# Patient Record
Sex: Male | Born: 1935
Health system: Southern US, Community
[De-identification: ages and names within clinical notes are randomized; demographics above are authoritative.]

## PROBLEM LIST (undated history)

## (undated) DIAGNOSIS — Z87438 Personal history of other diseases of male genital organs: Secondary | ICD-10-CM

## (undated) DIAGNOSIS — I499 Cardiac arrhythmia, unspecified: Secondary | ICD-10-CM

## (undated) DIAGNOSIS — Z86010 Personal history of colonic polyps: Secondary | ICD-10-CM

## (undated) DIAGNOSIS — Z1211 Encounter for screening for malignant neoplasm of colon: Secondary | ICD-10-CM

## (undated) DIAGNOSIS — I2510 Atherosclerotic heart disease of native coronary artery without angina pectoris: Secondary | ICD-10-CM

## (undated) DIAGNOSIS — K439 Ventral hernia without obstruction or gangrene: Secondary | ICD-10-CM

## (undated) DIAGNOSIS — Z8719 Personal history of other diseases of the digestive system: Secondary | ICD-10-CM

## (undated) DIAGNOSIS — E119 Type 2 diabetes mellitus without complications: Secondary | ICD-10-CM

## (undated) DIAGNOSIS — H9190 Unspecified hearing loss, unspecified ear: Secondary | ICD-10-CM

## (undated) DIAGNOSIS — R079 Chest pain, unspecified: Secondary | ICD-10-CM

## (undated) DIAGNOSIS — N400 Enlarged prostate without lower urinary tract symptoms: Secondary | ICD-10-CM

## (undated) DIAGNOSIS — N281 Cyst of kidney, acquired: Secondary | ICD-10-CM

## (undated) DIAGNOSIS — I10 Essential (primary) hypertension: Secondary | ICD-10-CM

## (undated) DIAGNOSIS — I429 Cardiomyopathy, unspecified: Secondary | ICD-10-CM

## (undated) DIAGNOSIS — I7789 Other specified disorders of arteries and arterioles: Secondary | ICD-10-CM

## (undated) DIAGNOSIS — R0600 Dyspnea, unspecified: Secondary | ICD-10-CM

## (undated) DIAGNOSIS — I480 Paroxysmal atrial fibrillation: Secondary | ICD-10-CM

## (undated) DIAGNOSIS — C801 Malignant (primary) neoplasm, unspecified: Secondary | ICD-10-CM

## (undated) DIAGNOSIS — Z860101 Personal history of adenomatous and serrated colon polyps: Secondary | ICD-10-CM

## (undated) HISTORY — PX: SKIN SURGERY: SHX2413

## (undated) HISTORY — PX: DENTAL SURGERY: SHX609

## (undated) HISTORY — PX: OTHER SURGICAL HISTORY: SHX169

## (undated) HISTORY — PX: SEPTOPLASTY: SUR1290

## (undated) HISTORY — DX: Type 2 diabetes mellitus without complications: E11.9

## (undated) HISTORY — PX: CARDIAC CATHETERIZATION: SHX172

## (undated) HISTORY — PX: TONSILLECTOMY: SUR1361

## (undated) HISTORY — PX: APPENDECTOMY: SHX54

## (undated) HISTORY — PX: EYE SURGERY: SHX253

## (undated) HISTORY — PX: CHOLECYSTECTOMY: SHX55

---

## 2006-01-06 ENCOUNTER — Emergency Department: Payer: Self-pay | Admitting: Internal Medicine

## 2006-08-14 ENCOUNTER — Ambulatory Visit: Payer: Self-pay | Admitting: Unknown Physician Specialty

## 2007-01-02 ENCOUNTER — Other Ambulatory Visit: Payer: Self-pay

## 2007-01-02 ENCOUNTER — Observation Stay: Payer: Self-pay | Admitting: Specialist

## 2007-03-26 ENCOUNTER — Ambulatory Visit: Payer: Self-pay | Admitting: Family Medicine

## 2007-04-14 ENCOUNTER — Ambulatory Visit: Payer: Self-pay | Admitting: Family Medicine

## 2008-01-18 ENCOUNTER — Ambulatory Visit: Payer: Self-pay | Admitting: Otolaryngology

## 2008-05-06 ENCOUNTER — Emergency Department: Payer: Self-pay | Admitting: Internal Medicine

## 2008-05-13 ENCOUNTER — Emergency Department: Payer: Self-pay | Admitting: Emergency Medicine

## 2010-01-18 ENCOUNTER — Emergency Department: Payer: Self-pay | Admitting: Emergency Medicine

## 2010-01-23 ENCOUNTER — Ambulatory Visit: Payer: Self-pay | Admitting: Internal Medicine

## 2010-07-05 ENCOUNTER — Emergency Department: Payer: Self-pay | Admitting: Emergency Medicine

## 2010-12-02 ENCOUNTER — Ambulatory Visit: Payer: Self-pay | Admitting: Internal Medicine

## 2011-04-28 ENCOUNTER — Ambulatory Visit: Payer: Self-pay | Admitting: Urology

## 2011-05-12 DIAGNOSIS — I420 Dilated cardiomyopathy: Secondary | ICD-10-CM | POA: Diagnosis present

## 2011-05-12 DIAGNOSIS — I10 Essential (primary) hypertension: Secondary | ICD-10-CM | POA: Diagnosis present

## 2011-11-11 DIAGNOSIS — I2510 Atherosclerotic heart disease of native coronary artery without angina pectoris: Secondary | ICD-10-CM | POA: Insufficient documentation

## 2011-11-17 ENCOUNTER — Ambulatory Visit: Payer: Self-pay | Admitting: Unknown Physician Specialty

## 2011-11-19 LAB — PATHOLOGY REPORT

## 2013-03-25 DIAGNOSIS — Q619 Cystic kidney disease, unspecified: Secondary | ICD-10-CM

## 2013-08-22 ENCOUNTER — Emergency Department: Payer: Self-pay | Admitting: Emergency Medicine

## 2014-07-12 DIAGNOSIS — E782 Mixed hyperlipidemia: Secondary | ICD-10-CM | POA: Diagnosis present

## 2015-03-29 ENCOUNTER — Other Ambulatory Visit: Payer: Self-pay | Admitting: Otolaryngology

## 2015-03-29 DIAGNOSIS — R221 Localized swelling, mass and lump, neck: Secondary | ICD-10-CM

## 2015-04-04 ENCOUNTER — Ambulatory Visit
Admission: RE | Admit: 2015-04-04 | Discharge: 2015-04-04 | Disposition: A | Discharge status: Home / Self Care | Payer: Medicare Other | Source: Ambulatory Visit | Attending: Otolaryngology | Admitting: Otolaryngology

## 2015-04-04 DIAGNOSIS — R221 Localized swelling, mass and lump, neck: Secondary | ICD-10-CM | POA: Diagnosis present

## 2015-11-17 ENCOUNTER — Encounter: Payer: Self-pay | Admitting: Emergency Medicine

## 2015-11-17 ENCOUNTER — Ambulatory Visit: Payer: Medicare Other

## 2015-11-17 ENCOUNTER — Ambulatory Visit
Admission: EM | Admit: 2015-11-17 | Discharge: 2015-11-17 | Disposition: A | Discharge status: Home / Self Care | Payer: Medicare Other | Attending: Family Medicine | Admitting: Family Medicine

## 2015-11-17 ENCOUNTER — Ambulatory Visit
Admission: EM | Admit: 2015-11-17 | Discharge: 2015-11-17 | Disposition: A | Discharge status: Home / Self Care | Payer: Self-pay

## 2015-11-17 DIAGNOSIS — Z7982 Long term (current) use of aspirin: Secondary | ICD-10-CM | POA: Diagnosis not present

## 2015-11-17 DIAGNOSIS — I10 Essential (primary) hypertension: Secondary | ICD-10-CM | POA: Diagnosis not present

## 2015-11-17 DIAGNOSIS — Z880 Allergy status to penicillin: Secondary | ICD-10-CM | POA: Insufficient documentation

## 2015-11-17 DIAGNOSIS — Z9049 Acquired absence of other specified parts of digestive tract: Secondary | ICD-10-CM | POA: Insufficient documentation

## 2015-11-17 DIAGNOSIS — J40 Bronchitis, not specified as acute or chronic: Secondary | ICD-10-CM | POA: Insufficient documentation

## 2015-11-17 DIAGNOSIS — I429 Cardiomyopathy, unspecified: Secondary | ICD-10-CM | POA: Diagnosis not present

## 2015-11-17 DIAGNOSIS — R0981 Nasal congestion: Secondary | ICD-10-CM | POA: Insufficient documentation

## 2015-11-17 DIAGNOSIS — R05 Cough: Secondary | ICD-10-CM | POA: Diagnosis present

## 2015-11-17 HISTORY — DX: Essential (primary) hypertension: I10

## 2015-11-17 HISTORY — DX: Cardiomyopathy, unspecified: I42.9

## 2015-11-17 MED ORDER — DOXYCYCLINE HYCLATE 100 MG PO CAPS
100.0000 mg | ORAL_CAPSULE | Freq: Two times a day (BID) | ORAL | 0 refills | Status: DC
Start: 2015-11-17 — End: 2017-10-20

## 2015-11-17 MED ORDER — BENZONATATE 100 MG PO CAPS: 100.0000 mg | ORAL_CAPSULE | Freq: Three times a day (TID) | ORAL | 0 refills | Status: DC | PRN

## 2015-11-17 NOTE — ED Triage Notes (Signed)
Patient c/o cough, chest congestion, and runny nose since Monday.  Patient denies fevers.

## 2015-11-17 NOTE — Discharge Instructions (Signed)
Take medication as prescribed. Rest.   Follow up with your primary care physician this week. Return to Urgent care for new or worsening concerns.

## 2015-11-17 NOTE — ED Provider Notes (Signed)
[facility] ____________________________________________  Time seen: Approximately 12:24 PM  I have reviewed the triage vital signs and the nursing notes.   HISTORY  Chief Complaint Cough and Nasal Congestion  HPI Malik Wells is a 81 y.o. male presents with wife at bedside for complaints of 6 days of runny nose, nasal congestion, postnasal drainage and intermittent cough. States cough occasionally productive of white mucous but reports mostly dry cough. Denies known sick contacts. Reports has not been taking any medication for same complaints. Reports continues to eat and drink well. States cough is worse with postnasal drainage. Reports cough is worse at night with associated postnasal drainage. Denies fevers.  Denies chest pain, shortness of breath, chest pain with deep breath, abdominal pain, dysuria, extremity swelling or actually pain. Reports has continued to remain active as normal. Denies recent sickness, recent antibiotic use.    Kirk Ruths., MD: PCP    Past Medical History:  Diagnosis Date  . Cardiomyopathy ([place])   . Hypertension     There are no active problems to display for this patient.   Past Surgical History:  Procedure Laterality Date  . APPENDECTOMY    . CHOLECYSTECTOMY    . TONSILLECTOMY      Current Outpatient Rx  . Order #: [id number] Class: Historical Med  . Order #: [id number] Class: Historical Med  . Order #: [id number] Class: Historical Med  . Order #: [id number] Class: Historical Med  . Order #: [id number] Class: Historical Med  . Order #: [id number] Class: Historical Med  . Order #: [id number] Class: Historical Med  . Order #: [id number] Class: Historical Med  . Order #: [id number] Class: Historical Med  . Order #: [id number] Class: Historical Med  . Order #: [id number] Class: Normal  . Order #: [id number] Class: Normal    No current facility-administered medications for this encounter.   Current Outpatient Prescriptions:  .   amLODipine (NORVASC) 5 MG tablet, Take 5 mg by mouth daily., Disp: , Rfl:  .  aspirin EC 81 MG tablet, Take 81 mg by mouth daily., Disp: , Rfl:  .  atorvastatin (LIPITOR) 20 MG tablet, Take 20 mg by mouth daily., Disp: , Rfl:  .  carvedilol (COREG) 3.125 MG tablet, Take 3.125 mg by mouth 2 (two) times daily with a meal., Disp: , Rfl:  .  clopidogrel (PLAVIX) 75 MG tablet, Take 75 mg by mouth daily., Disp: , Rfl:  .  dutasteride (AVODART) 0.5 MG capsule, Take 0.5 mg by mouth as directed., Disp: , Rfl:  .  enalapril (VASOTEC) 20 MG tablet, Take 20 mg by mouth 2 (two) times daily., Disp: , Rfl:  .  glucosamine-chondroitin 500-400 MG tablet, Take 1 tablet by mouth 2 (two) times daily., Disp: , Rfl:  .  Multiple Vitamin (MULTIVITAMIN) tablet, Take 1 tablet by mouth daily., Disp: , Rfl:  .  spironolactone (ALDACTONE) 25 MG tablet, Take 12.5 mg by mouth daily., Disp: , Rfl:  .  benzonatate (TESSALON PERLES) 100 MG capsule, Take 1 capsule (100 mg total) by mouth 3 (three) times daily as needed., Disp: 15 capsule, Rfl: 0 .  doxycycline (VIBRAMYCIN) 100 MG capsule, Take 1 capsule (100 mg total) by mouth 2 (two) times daily., Disp: 20 capsule, Rfl: 0  Allergies Levaquin [levofloxacin] and Penicillins  History reviewed. No pertinent family history.  Social History Social History  Substance Use Topics  . Smoking status: Never Smoker  . Smokeless tobacco: Never Used  . Alcohol use No    Review of Systems Constitutional: No fever/chills Eyes: No visual changes. ENT:  No sore throat. As above.  Cardiovascular: Denies chest pain. Respiratory: Denies shortness of breath. Gastrointestinal: No abdominal pain.  No nausea, no vomiting.  No diarrhea.  No constipation. Genitourinary: Negative for dysuria. Musculoskeletal: Negative for back pain. Skin: Negative for rash. Neurological: Negative for headaches, focal weakness or numbness.  10-point ROS otherwise  negative.  ____________________________________________   PHYSICAL EXAM:  VITAL SIGNS: ED Triage Vitals  Enc Vitals Group     BP 11/17/15 1158 (!) 153/81     Pulse Rate 11/17/15 1158 84     Resp 11/17/15 1158 16     Temp 11/17/15 1158 98.9 F (37.2 C)     Temp Source 11/17/15 1158 Oral     SpO2 11/17/15 1158 99 %     Weight 11/17/15 1159 220 lb (99.8 kg)     Height 11/17/15 1159 5' 10.5" (1.791 m)     Head Circumference --      Peak Flow --      Pain Score 11/17/15 1202 0     Pain Loc --      Pain Edu? --      Excl. in [place]? --     Constitutional: Alert and oriented. Well appearing and in no acute distress. Eyes: Conjunctivae are normal. PERRL. EOMI. Head: Atraumatic. No sinus tenderness to palpation. No swelling. No erythema.  Ears: no erythema, normal TMs bilaterally.   Nose:Nasal congestion with clear rhinorrhea  Mouth/Throat: Mucous membranes are moist. No pharyngeal erythema. No tonsillar swelling or exudate.  Neck: No stridor.  No cervical spine tenderness to palpation. Hematological/Lymphatic/Immunilogical: No cervical lymphadenopathy. Cardiovascular: Normal rate, regular rhythm. Grossly normal heart sounds.  Good peripheral circulation. Respiratory: Normal respiratory effort.  No retractions. Mild scattered rhonchi. No wheezes. Good air movement. Occasional dry intermittent cough noted.   Gastrointestinal: Soft and nontender. No CVA tenderness. Musculoskeletal: No lower or upper extremity tenderness nor edema. No cervical, thoracic or lumbar tenderness to palpation. Neurologic:  Normal speech and language. No gross focal neurologic deficits are appreciated. No gait instability. Skin:  Skin is warm, dry and intact. No rash noted. Psychiatric: Mood and affect are normal. Speech and behavior are normal.  ___________________________________________   LABS (all labs ordered are listed, but only abnormal results are displayed)  Labs Reviewed - No data to  display ____________________________________________  RADIOLOGY  Dg Chest 2 View  Result Date: 11/17/2015 CLINICAL DATA:  Pt with cough and congestion x three days that started out with a sore throat five days ago. Hx of cardiomyopathy, hypertension, cholecystectomy. EXAM: CHEST  2 VIEW COMPARISON:  01/18/2010. FINDINGS: Mild linear opacity at the left lung base consistent with scarring or atelectasis, similar to the prior study. Elevation of the left hemidiaphragm is also stable. Lungs otherwise clear. No pleural effusion. No pneumothorax. Cardiac silhouette is normal in size. No mediastinal or hilar masses or evidence of adenopathy. Skeletal structures are demineralized but intact. IMPRESSION: No active cardiopulmonary disease. Electronically Signed   By: Lajean Manes M.D.   On: 11/17/2015 12:58   ____________________________________________   PROCEDURES Procedures   INITIAL IMPRESSION / ASSESSMENT AND PLAN / ED COURSE  Pertinent labs & imaging results that were available during my care of the patient were reviewed by me and considered in my medical decision making (see chart for details).  Well-appearing patient. Vital signs stable. Presenting for the complaints of cough and congestion for the last 6 days. Mild scattered rhonchi. Chest x-ray per radiologist no active cardiopulmonary disease. Discussed in detail in with  patient and spouse, may be viral process however with patient's comorbidities, concern for bacterial process will place patient on oral doxycycline. Encouraged supportive care, rest, fluids when necessary Tessalon Perles as needed.  Discussed follow up with Primary care physician this week. Discussed follow up and return parameters including no resolution or any worsening concerns. Patient verbalized understanding and agreed to plan.   ____________________________________________   FINAL CLINICAL IMPRESSION(S) / ED DIAGNOSES  Final diagnoses:  Bronchitis      Discharge Medication List as of 11/17/2015  1:13 PM    START taking these medications   Details  benzonatate (TESSALON PERLES) 100 MG capsule Take 1 capsule (100 mg total) by mouth 3 (three) times daily as needed., Starting Sat 11/17/2015, Normal    doxycycline (VIBRAMYCIN) 100 MG capsule Take 1 capsule (100 mg total) by mouth 2 (two) times daily., Starting Sat 11/17/2015, Normal        Note: This dictation was prepared with Dragon dictation along with smaller phrase technology. Any transcriptional errors that result from this process are unintentional.    Clinical Course      Marylene Land, NP 11/17/15 1442

## 2017-05-26 ENCOUNTER — Encounter: Payer: Self-pay | Admitting: Emergency Medicine

## 2017-05-27 ENCOUNTER — Encounter
Admission: RE | Disposition: A | Discharge status: Home / Self Care | Payer: Self-pay | Source: Ambulatory Visit | Attending: Unknown Physician Specialty

## 2017-05-27 ENCOUNTER — Ambulatory Visit: Payer: Medicare Other | Admitting: Anesthesiology

## 2017-05-27 ENCOUNTER — Ambulatory Visit
Admission: RE | Admit: 2017-05-27 | Discharge: 2017-05-27 | Disposition: A | Discharge status: Home / Self Care | Payer: Medicare Other | Source: Ambulatory Visit | Attending: Unknown Physician Specialty | Admitting: Unknown Physician Specialty

## 2017-05-27 ENCOUNTER — Encounter: Payer: Self-pay | Admitting: *Deleted

## 2017-05-27 ENCOUNTER — Other Ambulatory Visit: Payer: Self-pay

## 2017-05-27 DIAGNOSIS — Z86010 Personal history of colonic polyps: Secondary | ICD-10-CM | POA: Diagnosis not present

## 2017-05-27 DIAGNOSIS — I110 Hypertensive heart disease with heart failure: Secondary | ICD-10-CM | POA: Insufficient documentation

## 2017-05-27 DIAGNOSIS — I509 Heart failure, unspecified: Secondary | ICD-10-CM | POA: Insufficient documentation

## 2017-05-27 DIAGNOSIS — Z7902 Long term (current) use of antithrombotics/antiplatelets: Secondary | ICD-10-CM | POA: Insufficient documentation

## 2017-05-27 DIAGNOSIS — Z7982 Long term (current) use of aspirin: Secondary | ICD-10-CM | POA: Diagnosis not present

## 2017-05-27 DIAGNOSIS — N400 Enlarged prostate without lower urinary tract symptoms: Secondary | ICD-10-CM | POA: Insufficient documentation

## 2017-05-27 DIAGNOSIS — Z79899 Other long term (current) drug therapy: Secondary | ICD-10-CM | POA: Diagnosis not present

## 2017-05-27 DIAGNOSIS — K5520 Angiodysplasia of colon without hemorrhage: Secondary | ICD-10-CM | POA: Diagnosis not present

## 2017-05-27 DIAGNOSIS — I429 Cardiomyopathy, unspecified: Secondary | ICD-10-CM | POA: Insufficient documentation

## 2017-05-27 DIAGNOSIS — Z800 Family history of malignant neoplasm of digestive organs: Secondary | ICD-10-CM | POA: Insufficient documentation

## 2017-05-27 DIAGNOSIS — K640 First degree hemorrhoids: Secondary | ICD-10-CM | POA: Diagnosis not present

## 2017-05-27 DIAGNOSIS — Z1211 Encounter for screening for malignant neoplasm of colon: Secondary | ICD-10-CM | POA: Insufficient documentation

## 2017-05-27 HISTORY — DX: Chest pain, unspecified: R07.9

## 2017-05-27 HISTORY — PX: COLONOSCOPY WITH PROPOFOL: SHX5780

## 2017-05-27 HISTORY — DX: Encounter for screening for malignant neoplasm of colon: Z12.11

## 2017-05-27 HISTORY — DX: Other specified disorders of arteries and arterioles: I77.89

## 2017-05-27 HISTORY — DX: Benign prostatic hyperplasia without lower urinary tract symptoms: N40.0

## 2017-05-27 HISTORY — DX: Personal history of colonic polyps: Z86.010

## 2017-05-27 HISTORY — DX: Personal history of adenomatous and serrated colon polyps: Z86.0101

## 2017-05-27 HISTORY — DX: Cyst of kidney, acquired: N28.1

## 2017-05-27 HISTORY — DX: Ventral hernia without obstruction or gangrene: K43.9

## 2017-05-27 HISTORY — DX: Paroxysmal atrial fibrillation: I48.0

## 2017-05-27 SURGERY — COLONOSCOPY WITH PROPOFOL
Anesthesia: General

## 2017-05-27 MED ORDER — FENTANYL CITRATE (PF) 100 MCG/2ML IJ SOLN
INTRAMUSCULAR | Status: AC
  Filled 2017-05-27: qty 2

## 2017-05-27 MED ORDER — PROPOFOL 500 MG/50ML IV EMUL
INTRAVENOUS | Status: AC
  Filled 2017-05-27: qty 50

## 2017-05-27 MED ORDER — PHENYLEPHRINE HCL 10 MG/ML IJ SOLN
INTRAMUSCULAR | Status: DC | PRN
  Administered 2017-05-27 (×2): 100 ug via INTRAVENOUS

## 2017-05-27 MED ORDER — LIDOCAINE 2% (20 MG/ML) 5 ML SYRINGE
INTRAMUSCULAR | Status: DC | PRN
  Administered 2017-05-27: 30 mg via INTRAVENOUS

## 2017-05-27 MED ORDER — PROPOFOL 500 MG/50ML IV EMUL
INTRAVENOUS | Status: DC | PRN
  Administered 2017-05-27: 160 ug/kg/min via INTRAVENOUS

## 2017-05-27 MED ORDER — SODIUM CHLORIDE 0.9 % IV SOLN
INTRAVENOUS | Status: DC
  Administered 2017-05-27 (×2): via INTRAVENOUS

## 2017-05-27 MED ORDER — PROPOFOL 10 MG/ML IV BOLUS
INTRAVENOUS | Status: DC | PRN
  Administered 2017-05-27: 100 mg via INTRAVENOUS

## 2017-05-27 MED ORDER — SODIUM CHLORIDE 0.9 % IV SOLN: INTRAVENOUS | Status: DC

## 2017-05-27 MED ORDER — FENTANYL CITRATE (PF) 100 MCG/2ML IJ SOLN
INTRAMUSCULAR | Status: DC | PRN
  Administered 2017-05-27: 50 ug via INTRAVENOUS

## 2017-05-27 NOTE — Anesthesia Preprocedure Evaluation (Signed)
Anesthesia Evaluation  Patient identified by MRN, date of birth, ID band Patient awake    Reviewed: Allergy & Precautions, NPO status , Patient's Chart, lab work & pertinent test results  Airway Mallampati: II  TM Distance: >3 FB     Dental   Pulmonary neg pulmonary ROS,    Pulmonary exam normal        Cardiovascular hypertension, +CHF  Normal cardiovascular exam+ dysrhythmias Atrial Fibrillation      Neuro/Psych    GI/Hepatic Neg liver ROS,   Endo/Other  negative endocrine ROS  Renal/GU Renal disease     Musculoskeletal negative musculoskeletal ROS (+)   Abdominal Normal abdominal exam  (+)   Peds negative pediatric ROS (+)  Hematology negative hematology ROS (+)   Anesthesia Other Findings   Reproductive/Obstetrics                             Anesthesia Physical Anesthesia Plan  ASA: III  Anesthesia Plan: General   Post-op Pain Management:    Induction: Intravenous  PONV Risk Score and Plan:   Airway Management Planned: Nasal Cannula  Additional Equipment:   Intra-op Plan:   Post-operative Plan:   Informed Consent: I have reviewed the patients History and Physical, chart, labs and discussed the procedure including the risks, benefits and alternatives for the proposed anesthesia with the patient or authorized representative who has indicated his/her understanding and acceptance.   Dental advisory given  Plan Discussed with: CRNA and Anesthesiologist  Anesthesia Plan Comments:         Anesthesia Quick Evaluation

## 2017-05-27 NOTE — Transfer of Care (Signed)
Immediate Anesthesia Transfer of Care Note  Patient: Malik Wells  Procedure(s) Performed: COLONOSCOPY WITH PROPOFOL (N/A )  Patient Location: PACU and Endoscopy Unit  Anesthesia Type:General  Level of Consciousness: sedated  Airway & Oxygen Therapy: Patient Spontanous Breathing and Patient connected to nasal cannula oxygen  Post-op Assessment: Report given to RN and Post -op Vital signs reviewed and stable  Post vital signs: Reviewed and stable  Last Vitals:  Vitals Value Taken Time  BP    Temp    Pulse 63 05/27/2017 11:40 AM  Resp 12 05/27/2017 11:40 AM  SpO2 97 % 05/27/2017 11:40 AM  Vitals shown include unvalidated device data.  Last Pain:  Vitals:   05/27/17 1045  TempSrc: Tympanic  PainSc: 0-No pain         Complications: No apparent anesthesia complications

## 2017-05-27 NOTE — Anesthesia Postprocedure Evaluation (Signed)
Anesthesia Post Note  Patient: Malik Wells  Procedure(s) Performed: COLONOSCOPY WITH PROPOFOL (N/A )  Patient location during evaluation: PACU Anesthesia Type: General Level of consciousness: awake and alert and oriented Pain management: pain level controlled Vital Signs Assessment: post-procedure vital signs reviewed and stable Respiratory status: spontaneous breathing Cardiovascular status: blood pressure returned to baseline Anesthetic complications: no     Last Vitals:  Vitals:   05/27/17 1141 05/27/17 1210  BP: (!) 70/46 137/88  Pulse: 63   Resp: 15   Temp: (!) 36.1 C   SpO2: 98%     Last Pain:  Vitals:   05/27/17 1210  TempSrc:   PainSc: 0-No pain                 Graeme Menees

## 2017-05-27 NOTE — Op Note (Signed)
[facility] Patient Name: Malik Wells Procedure Date: 05/27/2017 11:08 AM MRN: [id number] Account #: [id number] Date of Birth: [birth year] Admit Type: Outpatient Age: 83 Room: [facility] ENDO ROOM 3 Gender: Male Note Status: Finalized Procedure:            Colonoscopy Indications:          High risk colon cancer surveillance: Personal history                        of colonic polyps, Family history of colon cancer in a                        first-degree relative Providers:            Manya Silvas, MD Referring MD:         Ocie Cornfield. Ouida Sills MD, MD (Referring MD) Medicines:            Propofol per Anesthesia Complications:        No immediate complications. Procedure:            Pre-Anesthesia Assessment:                       - After reviewing the risks and benefits, the patient                        was deemed in satisfactory condition to undergo the                        procedure.                       After obtaining informed consent, the colonoscope was                        passed under direct vision. Throughout the procedure,                        the patient's blood pressure, pulse, and oxygen                        saturations were monitored continuously. The                        Colonoscope was introduced through the anus and                        advanced to the the cecum, identified by appendiceal                        orifice and ileocecal valve. The colonoscopy was                        performed without difficulty. The patient tolerated the                        procedure well. The quality of the bowel preparation                        was excellent. Findings:      A single very small angioectasia without bleeding was found in the cecum.  Internal hemorrhoids were found during endoscopy. The hemorrhoids were       medium-sized and Grade I (internal hemorrhoids that do not prolapse).      The exam was otherwise  without abnormality. Impression:           - A single non-bleeding colonic angioectasia.                       - Internal hemorrhoids.                       - The examination was otherwise normal.                       - No specimens collected. Recommendation:       - The findings and recommendations were discussed with                        the patient's family. Manya Silvas, MD 05/27/2017 11:36:55 AM This report has been signed electronically. Number of Addenda: 0 Note Initiated On: 05/27/2017 11:08 AM Scope Withdrawal Time: 0 hours 7 minutes 24 seconds  Total Procedure Duration: 0 hours 14 minutes 23 seconds       [facility]

## 2017-05-27 NOTE — Anesthesia Post-op Follow-up Note (Signed)
Anesthesia QCDR form completed.        

## 2017-05-27 NOTE — H&P (Signed)
Primary Care Physician:  Kirk Ruths, MD Primary Gastroenterologist:  Dr. Vira Agar  Pre-Procedure History & Physical: HPI:  Malik Wells is a 83 y.o. male is here for an colonoscopy.  For [place] colon polyps and FH colon cancer in father.   Past Medical History:  Diagnosis Date  . BPH (benign prostatic hyperplasia)   . Cardiomyopathy ([place])   . Chest pain, unspecified   . Coronary artery ectasia ([place])   . Encounter for colonoscopy due to history of adenomatous colonic polyps   . Hypertension   . Kidney cyst, acquired   . PAF (paroxysmal atrial fibrillation) ([place])   . Ventral hernia     Past Surgical History:  Procedure Laterality Date  . APPENDECTOMY    . CHOLECYSTECTOMY    . DENTAL SURGERY    . SEPTOPLASTY    . SKIN SURGERY     skin cancer surgery  . 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 TONSILLECTOMY      Prior to Admission medications   Medication Sig Start Date End Date Taking? Authorizing Provider  ALPRAZolam (XANAX) 0.25 MG tablet Take 0.25 mg by mouth once.   Yes [provider]  amLODipine (NORVASC) 5 MG tablet Take 5 mg by mouth daily.   Yes [provider]  aspirin EC 81 MG tablet Take 81 mg by mouth daily.   Yes [provider]  atorvastatin (LIPITOR) 20 MG tablet Take 20 mg by mouth daily.   Yes [provider]  carvedilol (COREG) 3.125 MG tablet Take 3.125 mg by mouth 2 (two) times daily with a meal.   Yes [provider]  clopidogrel (PLAVIX) 75 MG tablet Take 75 mg by mouth daily.   Yes [provider]  dutasteride (AVODART) 0.5 MG capsule Take 0.5 mg by mouth as directed.   Yes [provider]  enalapril (VASOTEC) 20 MG tablet Take 20 mg by mouth 2 (two) times daily.   Yes [provider]  glucosamine-chondroitin 500-400 MG tablet Take 1 tablet by mouth 2 (two) times daily.   Yes [provider]  imiquimod (ALDARA) 5 % cream Apply  topically daily as needed.   Yes [provider]  Multiple Vitamin (MULTIVITAMIN) tablet Take 1 tablet by mouth daily.   Yes [provider]  spironolactone (ALDACTONE) 25 MG tablet Take 12.5 mg by mouth daily.   Yes [provider]  benzonatate (TESSALON PERLES) 100 MG capsule Take 1 capsule (100 mg total) by mouth 3 (three) times daily as needed. Patient not taking: Reported on 05/27/2017 11/17/15   Marylene Land, NP  doxycycline (VIBRAMYCIN) 100 MG capsule Take 1 capsule (100 mg total) by mouth 2 (two) times daily. Patient not taking: Reported on 05/27/2017 11/17/15   Marylene Land, NP    Allergies as of 05/05/2017 - Review Complete 11/17/2015  Allergen Reaction Noted  . Levaquin [levofloxacin] Other (See Comments) 11/17/2015  . Penicillins Hives 11/17/2015    Family History  Problem Relation Age of Onset  . Alzheimer's disease Mother   . Osteoarthritis Mother   . Hypertension Father   . Stroke Father   . Colon cancer Father   . Prostate cancer Father     Social History   Socioeconomic History  . Marital status: Married    Spouse name: Not on file  . Number of children: Not on file  . Years of education: Not on file  . Highest education level: Not on  file  Occupational History  . Not on file  Social Needs  . Financial resource strain: Not on file  . Food insecurity:    Worry: Not on file    Inability: Not on file  . Transportation needs:    Medical: Not on file    Non-medical: Not on file  Tobacco Use  . Smoking status: Never Smoker  . Smokeless tobacco: Never Used  Substance and Sexual Activity  . Alcohol use: Yes    Comment: occas  . Drug use: No  . Sexual activity: Not Currently  Lifestyle  . Physical activity:    Days per week: Not on file    Minutes per session: Not on file  . Stress: Not on file  Relationships  . Social connections:    Talks on phone: Not on file    Gets together: Not on file    Attends religious service:  Not on file    Active member of club or organization: Not on file    Attends meetings of clubs or organizations: Not on file    Relationship status: Not on file  . Intimate partner violence:    Fear of current or ex partner: Not on file    Emotionally abused: Not on file    Physically abused: Not on file    Forced sexual activity: Not on file  Other Topics Concern  . Not on file  Social History Narrative  . Not on file    Review of Systems: See HPI, otherwise negative ROS  Physical Exam: BP (!) 149/98   Pulse 75   Temp 97.8 F (36.6 C) (Tympanic)   Resp 18   Ht 5\' 5"  (1.651 m)   Wt 101.6 kg (224 lb)   SpO2 99%   BMI 37.28 kg/m  General:   Alert,  pleasant and cooperative in NAD Head:  Normocephalic and atraumatic. Neck:  Supple; no masses or thyromegaly. Lungs:  Clear throughout to auscultation.    Heart:  Regular rate and rhythm. Abdomen:  Soft, nontender and nondistended. Normal bowel sounds, without guarding, and without rebound.   Neurologic:  Alert and  oriented x4;  grossly normal neurologically.  Impression/Plan: SANJEEV MAIN is here for an colonoscopy to be performed for [facility] colon polyps and FH colon cancer in father  Risks, benefits, limitations, and alternatives regarding  colonoscopy have been reviewed with the patient.  Questions have been answered.  All parties agreeable.   Gaylyn Cheers, MD  05/27/2017, 11:11 AM

## 2017-10-26 ENCOUNTER — Encounter: Payer: Self-pay | Admitting: *Deleted

## 2017-10-27 ENCOUNTER — Encounter
Admission: RE | Disposition: A | Discharge status: Home / Self Care | Payer: Self-pay | Source: Ambulatory Visit | Attending: Ophthalmology

## 2017-10-27 ENCOUNTER — Encounter: Payer: Self-pay | Admitting: Certified Registered Nurse Anesthetist

## 2017-10-27 ENCOUNTER — Other Ambulatory Visit: Payer: Self-pay

## 2017-10-27 ENCOUNTER — Ambulatory Visit: Payer: Medicare Other | Admitting: Certified Registered Nurse Anesthetist

## 2017-10-27 ENCOUNTER — Ambulatory Visit
Admission: RE | Admit: 2017-10-27 | Discharge: 2017-10-27 | Disposition: A | Discharge status: Home / Self Care | Payer: Medicare Other | Source: Ambulatory Visit | Attending: Ophthalmology | Admitting: Ophthalmology

## 2017-10-27 DIAGNOSIS — I10 Essential (primary) hypertension: Secondary | ICD-10-CM | POA: Insufficient documentation

## 2017-10-27 DIAGNOSIS — H2512 Age-related nuclear cataract, left eye: Secondary | ICD-10-CM | POA: Insufficient documentation

## 2017-10-27 DIAGNOSIS — I4891 Unspecified atrial fibrillation: Secondary | ICD-10-CM | POA: Diagnosis not present

## 2017-10-27 DIAGNOSIS — E669 Obesity, unspecified: Secondary | ICD-10-CM | POA: Insufficient documentation

## 2017-10-27 DIAGNOSIS — Z6832 Body mass index (BMI) 32.0-32.9, adult: Secondary | ICD-10-CM | POA: Insufficient documentation

## 2017-10-27 HISTORY — DX: Atherosclerotic heart disease of native coronary artery without angina pectoris: I25.10

## 2017-10-27 HISTORY — DX: Cardiac arrhythmia, unspecified: I49.9

## 2017-10-27 HISTORY — DX: Personal history of other diseases of male genital organs: Z87.438

## 2017-10-27 HISTORY — DX: Unspecified hearing loss, unspecified ear: H91.90

## 2017-10-27 HISTORY — DX: Dyspnea, unspecified: R06.00

## 2017-10-27 HISTORY — PX: CATARACT EXTRACTION W/PHACO: SHX586

## 2017-10-27 HISTORY — DX: Malignant (primary) neoplasm, unspecified: C80.1

## 2017-10-27 SURGERY — PHACOEMULSIFICATION, CATARACT, WITH IOL INSERTION
Anesthesia: Monitor Anesthesia Care | Site: Eye | Laterality: Left

## 2017-10-27 MED ORDER — ARMC OPHTHALMIC DILATING DROPS
1.0000 "application " | OPHTHALMIC | Status: AC
  Administered 2017-10-27 (×3): 1 via OPHTHALMIC

## 2017-10-27 MED ORDER — MOXIFLOXACIN HCL 0.5 % OP SOLN
OPHTHALMIC | Status: DC | PRN
  Administered 2017-10-27: .2 mL via OPHTHALMIC

## 2017-10-27 MED ORDER — MOXIFLOXACIN HCL 0.5 % OP SOLN
OPHTHALMIC | Status: AC
  Filled 2017-10-27: qty 3

## 2017-10-27 MED ORDER — TETRACAINE HCL 0.5 % OP SOLN
1.0000 [drp] | OPHTHALMIC | Status: AC | PRN
  Administered 2017-10-27 (×3): 1 [drp] via OPHTHALMIC

## 2017-10-27 MED ORDER — NA CHONDROIT SULF-NA HYALURON 40-17 MG/ML IO SOLN
INTRAOCULAR | Status: AC
  Filled 2017-10-27: qty 1

## 2017-10-27 MED ORDER — SODIUM CHLORIDE 0.9 % IV SOLN
INTRAVENOUS | Status: DC
  Administered 2017-10-27: 11:00:00 via INTRAVENOUS

## 2017-10-27 MED ORDER — POVIDONE-IODINE 5 % OP SOLN
OPHTHALMIC | Status: AC
  Filled 2017-10-27: qty 30

## 2017-10-27 MED ORDER — MIDAZOLAM HCL 2 MG/2ML IJ SOLN
INTRAMUSCULAR | Status: AC
  Filled 2017-10-27: qty 2

## 2017-10-27 MED ORDER — ONDANSETRON HCL 4 MG/2ML IJ SOLN: INTRAMUSCULAR | Status: DC | PRN

## 2017-10-27 MED ORDER — POLYMYXIN B-TRIMETHOPRIM 10000-0.1 UNIT/ML-% OP SOLN
OPHTHALMIC | Status: AC
  Filled 2017-10-27: qty 10

## 2017-10-27 MED ORDER — POVIDONE-IODINE 5 % OP SOLN
OPHTHALMIC | Status: DC | PRN
  Administered 2017-10-27: 1 via OPHTHALMIC

## 2017-10-27 MED ORDER — EPINEPHRINE PF 1 MG/ML IJ SOLN
INTRAMUSCULAR | Status: AC
  Filled 2017-10-27: qty 1

## 2017-10-27 MED ORDER — CARBACHOL 0.01 % IO SOLN
INTRAOCULAR | Status: DC | PRN
  Administered 2017-10-27: .5 mL via INTRAOCULAR

## 2017-10-27 MED ORDER — MIDAZOLAM HCL 2 MG/2ML IJ SOLN
INTRAMUSCULAR | Status: DC | PRN
  Administered 2017-10-27: 0.5 mg via INTRAVENOUS
  Administered 2017-10-27: 1 mg via INTRAVENOUS

## 2017-10-27 MED ORDER — TETRACAINE HCL 0.5 % OP SOLN
OPHTHALMIC | Status: AC
  Filled 2017-10-27: qty 4

## 2017-10-27 MED ORDER — EPINEPHRINE PF 1 MG/ML IJ SOLN
INTRAOCULAR | Status: DC | PRN
  Administered 2017-10-27: 1 mL via OPHTHALMIC

## 2017-10-27 MED ORDER — POLYMYXIN B-TRIMETHOPRIM 10000-0.1 UNIT/ML-% OP SOLN: 1.0000 [drp] | OPHTHALMIC | Status: DC | PRN

## 2017-10-27 MED ORDER — ARMC OPHTHALMIC DILATING DROPS
OPHTHALMIC | Status: AC
  Filled 2017-10-27: qty 0.5

## 2017-10-27 MED ORDER — LIDOCAINE HCL (PF) 4 % IJ SOLN
INTRAMUSCULAR | Status: AC
  Filled 2017-10-27: qty 5

## 2017-10-27 MED ORDER — NA CHONDROIT SULF-NA HYALURON 40-17 MG/ML IO SOLN
INTRAOCULAR | Status: DC | PRN
  Administered 2017-10-27: 1 mL via INTRAOCULAR

## 2017-10-27 MED ORDER — LIDOCAINE HCL (PF) 4 % IJ SOLN
INTRAOCULAR | Status: DC | PRN
  Administered 2017-10-27: 2 mL via OPHTHALMIC

## 2017-10-27 SURGICAL SUPPLY — 16 items
GLOVE BIO SURGEON STRL SZ8 (GLOVE) ×2 IMPLANT
GLOVE BIOGEL M 6.5 STRL (GLOVE) ×2 IMPLANT
GLOVE SURG LX 8.0 MICRO (GLOVE) ×1
GLOVE SURG LX STRL 8.0 MICRO (GLOVE) ×1 IMPLANT
GOWN STRL REUS W/ TWL LRG LVL3 (GOWN DISPOSABLE) ×2 IMPLANT
GOWN STRL REUS W/TWL LRG LVL3 (GOWN DISPOSABLE) ×2
LABEL CATARACT MEDS ST (LABEL) ×2 IMPLANT
LENS IOL TECNIS ITEC 15.5 (Intraocular Lens) ×2 IMPLANT
PACK CATARACT (MISCELLANEOUS) ×2 IMPLANT
PACK CATARACT BRASINGTON LX (MISCELLANEOUS) ×2 IMPLANT
PACK EYE AFTER SURG (MISCELLANEOUS) ×2 IMPLANT
SOL BSS BAG (MISCELLANEOUS) ×2
SOLUTION BSS BAG (MISCELLANEOUS) ×1 IMPLANT
SYR 5ML LL (SYRINGE) ×2 IMPLANT
WATER STERILE IRR 250ML POUR (IV SOLUTION) ×2 IMPLANT
WIPE NON LINTING 3.25X3.25 (MISCELLANEOUS) ×2 IMPLANT

## 2017-10-27 NOTE — Op Note (Signed)
PREOPERATIVE DIAGNOSIS:  Nuclear sclerotic cataract of the left eye.   POSTOPERATIVE DIAGNOSIS:  Nuclear sclerotic cataract of the left eye.   OPERATIVE PROCEDURE: Procedure(s): CATARACT EXTRACTION PHACO AND INTRAOCULAR LENS PLACEMENT (IOC)   SURGEON:  Birder Robson, MD.   ANESTHESIA:  Anesthesiologist: Emmie Niemann, MD CRNA: Eben Burow, CRNA  1.      Managed anesthesia care. 2.     0.64ml of Shugarcaine was instilled following the paracentesis   COMPLICATIONS:  None.   TECHNIQUE:   Stop and chop   DESCRIPTION OF PROCEDURE:  The patient was examined and consented in the preoperative holding area where the aforementioned topical anesthesia was applied to the left eye and then brought back to the Operating Room where the left eye was prepped and draped in the usual sterile ophthalmic fashion and a lid speculum was placed. A paracentesis was created with the side port blade and the anterior chamber was filled with viscoelastic. A near clear corneal incision was performed with the steel keratome. A continuous curvilinear capsulorrhexis was performed with a cystotome followed by the capsulorrhexis forceps. Hydrodissection and hydrodelineation were carried out with BSS on a blunt cannula. The lens was removed in a stop and chop  technique and the remaining cortical material was removed with the irrigation-aspiration handpiece. The capsular bag was inflated with viscoelastic and the Technis [id number] lens was placed in the capsular bag without complication. The remaining viscoelastic was removed from the eye with the irrigation-aspiration handpiece. The wounds were hydrated. The anterior chamber was flushed with Miostat and the eye was inflated to physiologic pressure. 0.60ml Vigamox was placed in the anterior chamber. The wounds were found to be water tight. The eye was dressed with Vigamox. The patient was given protective glasses to wear throughout the day and a shield with which to sleep  tonight. The patient was also given drops with which to begin a drop regimen today and will follow-up with me in one day. Implant Name Type Inv. Item Serial No. Manufacturer Lot No. LRB No. Used  LENS IOL DIOP 15.5 - [id number] Intraocular Lens LENS IOL DIOP 15.5 [id number] AMO  Left 1    Procedure(s) with comments: CATARACT EXTRACTION PHACO AND INTRAOCULAR LENS PLACEMENT (IOC) (Left) - [place] 00:44 CDE 6.12 fluid pack lot # [id number]  Electronically signed: Birder Robson 10/27/2017 12:02 PM

## 2017-10-27 NOTE — Discharge Instructions (Signed)
Eye Surgery Discharge Instructions    Expect mild scratchy sensation or mild soreness. DO NOT RUB YOUR EYE!  The day of surgery:  Minimal physical activity, but bed rest is not required  No reading, computer work, or close hand work  No bending, lifting, or straining.  May watch TV  For 24 hours:  No driving, legal decisions, or alcoholic beverages  Safety precautions  Eat anything you prefer: It is better to start with liquids, then soup then solid foods.  _____ Eye patch should be worn until postoperative exam tomorrow.  ____ Solar shield eyeglasses should be worn for comfort in the sunlight/patch while sleeping  Resume all regular medications including aspirin or Coumadin if these were discontinued prior to surgery. You may shower, bathe, shave, or wash your hair. Tylenol may be taken for mild discomfort.  Call your doctor if you experience significant pain, nausea, or vomiting, fever > 101 or other signs of infection. [phone] or [phone] Specific instructions:  Follow-up Information    Birder Robson, MD Follow up.   Specialty:  Ophthalmology Why:  October 16 at 9:20am Contact information: [address] [place] [address] [phone]

## 2017-10-27 NOTE — Anesthesia Postprocedure Evaluation (Signed)
Anesthesia Post Note  Patient: Malik Wells  Procedure(s) Performed: CATARACT EXTRACTION PHACO AND INTRAOCULAR LENS PLACEMENT ([place]) (Left Eye)  Patient location during evaluation: PACU (Phase II) Anesthesia Type: MAC Level of consciousness: awake and alert Pain management: pain level controlled Vital Signs Assessment: post-procedure vital signs reviewed and stable Respiratory status: spontaneous breathing, nonlabored ventilation, respiratory function stable and patient connected to nasal cannula oxygen Cardiovascular status: stable and blood pressure returned to baseline Postop Assessment: no apparent nausea or vomiting Anesthetic complications: no     Last Vitals:  Vitals:   10/27/17 1031 10/27/17 1200  BP: (!) 154/74 (!) 148/88  Pulse: 74   Resp: 14 14  Temp: 36.5 C (!) 36.2 C  SpO2: 100%     Last Pain:  Vitals:   10/27/17 1200  TempSrc:   PainSc: 0-No pain                 Alison Stalling

## 2017-10-27 NOTE — Transfer of Care (Signed)
Immediate Anesthesia Transfer of Care Note  Patient: Malik Wells  Procedure(s) Performed: CATARACT EXTRACTION PHACO AND INTRAOCULAR LENS PLACEMENT ([facility]) (Left Eye)  Patient Location: Short Stay  Anesthesia Type:MAC  Level of Consciousness: awake, alert , oriented and patient cooperative  Airway & Oxygen Therapy: Patient Spontanous Breathing  Post-op Assessment: Report given to RN and Post -op Vital signs reviewed and stable  Post vital signs: Reviewed and stable  Last Vitals:  Vitals Value Taken Time  BP    Temp    Pulse    Resp    SpO2      Last Pain:  Vitals:   10/27/17 1200  TempSrc:   PainSc: 0-No pain         Complications: No apparent anesthesia complications

## 2017-10-27 NOTE — Anesthesia Preprocedure Evaluation (Signed)
Anesthesia Evaluation  Patient identified by MRN, date of birth, ID band Patient awake    Reviewed: Allergy & Precautions, NPO status , Patient's Chart, lab work & pertinent test results  History of Anesthesia Complications Negative for: history of anesthetic complications  Airway Mallampati: II  TM Distance: >3 FB Neck ROM: Full    Dental  (+) Upper Dentures, Lower Dentures   Pulmonary neg sleep apnea, neg COPD,    breath sounds clear to auscultation- rhonchi (-) wheezing      Cardiovascular hypertension, (-) CAD, (-) Past MI, (-) Cardiac Stents and (-) CABG + dysrhythmias Atrial Fibrillation  Rhythm:Regular Rate:Normal - Systolic murmurs and - Diastolic murmurs    Neuro/Psych negative neurological ROS  negative psych ROS   GI/Hepatic negative GI ROS, Neg liver ROS,   Endo/Other  negative endocrine ROS  Renal/GU Renal disease (renal cysts)     Musculoskeletal negative musculoskeletal ROS (+)   Abdominal (+) + obese,   Peds  Hematology negative hematology ROS (+)   Anesthesia Other Findings Past Medical History: No date: BPH (benign prostatic hyperplasia) No date: Cancer (HCC)     Comment:  SKIN No date: Cardiomyopathy ([place]) No date: Chest pain, unspecified No date: Coronary artery disease No date: Coronary artery ectasia (HCC) No date: Dyspnea     Comment:  DOE No date: Dysrhythmia No date: Encounter for colonoscopy due to history of adenomatous  colonic polyps No date: History of BPH No date: HOH (hard of hearing)     Comment:  AIDS No date: Hypertension No date: Kidney cyst, acquired No date: PAF (paroxysmal atrial fibrillation) (HCC) No date: Ventral hernia   Reproductive/Obstetrics                             Anesthesia Physical Anesthesia Plan  ASA: III  Anesthesia Plan: MAC   Post-op Pain Management:    Induction: Intravenous  PONV Risk Score and Plan: 1 and  Midazolam  Airway Management Planned: Natural Airway  Additional Equipment:   Intra-op Plan:   Post-operative Plan:   Informed Consent: I have reviewed the patients History and Physical, chart, labs and discussed the procedure including the risks, benefits and alternatives for the proposed anesthesia with the patient or authorized representative who has indicated his/her understanding and acceptance.     Plan Discussed with: CRNA and Anesthesiologist  Anesthesia Plan Comments:         Anesthesia Quick Evaluation

## 2017-10-27 NOTE — Anesthesia Post-op Follow-up Note (Signed)
Anesthesia QCDR form completed.        

## 2017-10-27 NOTE — OR Nursing (Signed)
Discharge instructions discussed with pt and wife. Both voice understanding. 

## 2017-10-27 NOTE — H&P (Signed)
All labs reviewed. Abnormal studies sent to patients PCP when indicated.  Previous H&P reviewed, patient examined, there are NO CHANGES.  Malik Wells Porfilio10/15/201911:34 AM

## 2017-11-19 ENCOUNTER — Encounter: Payer: Self-pay | Admitting: *Deleted

## 2017-11-24 ENCOUNTER — Encounter: Payer: Self-pay | Admitting: Anesthesiology

## 2017-11-24 ENCOUNTER — Encounter
Admission: RE | Disposition: A | Discharge status: Home / Self Care | Payer: Self-pay | Source: Ambulatory Visit | Attending: Ophthalmology

## 2017-11-24 ENCOUNTER — Other Ambulatory Visit: Payer: Self-pay

## 2017-11-24 ENCOUNTER — Ambulatory Visit: Payer: Medicare Other | Admitting: Anesthesiology

## 2017-11-24 ENCOUNTER — Ambulatory Visit
Admission: RE | Admit: 2017-11-24 | Discharge: 2017-11-24 | Disposition: A | Discharge status: Home / Self Care | Payer: Medicare Other | Source: Ambulatory Visit | Attending: Ophthalmology | Admitting: Ophthalmology

## 2017-11-24 DIAGNOSIS — Z7902 Long term (current) use of antithrombotics/antiplatelets: Secondary | ICD-10-CM | POA: Diagnosis not present

## 2017-11-24 DIAGNOSIS — I10 Essential (primary) hypertension: Secondary | ICD-10-CM | POA: Insufficient documentation

## 2017-11-24 DIAGNOSIS — E669 Obesity, unspecified: Secondary | ICD-10-CM | POA: Insufficient documentation

## 2017-11-24 DIAGNOSIS — Z85828 Personal history of other malignant neoplasm of skin: Secondary | ICD-10-CM | POA: Diagnosis not present

## 2017-11-24 DIAGNOSIS — H2511 Age-related nuclear cataract, right eye: Secondary | ICD-10-CM | POA: Diagnosis present

## 2017-11-24 DIAGNOSIS — Z6832 Body mass index (BMI) 32.0-32.9, adult: Secondary | ICD-10-CM | POA: Diagnosis not present

## 2017-11-24 DIAGNOSIS — I4891 Unspecified atrial fibrillation: Secondary | ICD-10-CM | POA: Insufficient documentation

## 2017-11-24 DIAGNOSIS — M1990 Unspecified osteoarthritis, unspecified site: Secondary | ICD-10-CM | POA: Diagnosis not present

## 2017-11-24 DIAGNOSIS — Z79899 Other long term (current) drug therapy: Secondary | ICD-10-CM | POA: Insufficient documentation

## 2017-11-24 DIAGNOSIS — Z7982 Long term (current) use of aspirin: Secondary | ICD-10-CM | POA: Insufficient documentation

## 2017-11-24 DIAGNOSIS — E7800 Pure hypercholesterolemia, unspecified: Secondary | ICD-10-CM | POA: Diagnosis not present

## 2017-11-24 HISTORY — PX: CATARACT EXTRACTION W/PHACO: SHX586

## 2017-11-24 HISTORY — DX: Personal history of other diseases of the digestive system: Z87.19

## 2017-11-24 SURGERY — PHACOEMULSIFICATION, CATARACT, WITH IOL INSERTION
Anesthesia: Monitor Anesthesia Care | Site: Eye | Laterality: Right

## 2017-11-24 MED ORDER — NA CHONDROIT SULF-NA HYALURON 40-17 MG/ML IO SOLN
INTRAOCULAR | Status: DC | PRN
  Administered 2017-11-24: 1 mL via INTRAOCULAR

## 2017-11-24 MED ORDER — POVIDONE-IODINE 5 % OP SOLN
OPHTHALMIC | Status: AC
  Filled 2017-11-24: qty 30

## 2017-11-24 MED ORDER — LIDOCAINE HCL (PF) 4 % IJ SOLN
INTRAMUSCULAR | Status: AC
  Filled 2017-11-24: qty 5

## 2017-11-24 MED ORDER — EPINEPHRINE PF 1 MG/ML IJ SOLN
INTRAMUSCULAR | Status: AC
  Filled 2017-11-24: qty 1

## 2017-11-24 MED ORDER — FENTANYL CITRATE (PF) 100 MCG/2ML IJ SOLN
INTRAMUSCULAR | Status: AC
  Filled 2017-11-24: qty 2

## 2017-11-24 MED ORDER — FENTANYL CITRATE (PF) 100 MCG/2ML IJ SOLN
INTRAMUSCULAR | Status: DC | PRN
  Administered 2017-11-24 (×2): 50 ug via INTRAVENOUS

## 2017-11-24 MED ORDER — TETRACAINE HCL 0.5 % OP SOLN
OPHTHALMIC | Status: AC
  Administered 2017-11-24: 1 [drp] via OPHTHALMIC
  Filled 2017-11-24: qty 4

## 2017-11-24 MED ORDER — POLYMYXIN B-TRIMETHOPRIM 10000-0.1 UNIT/ML-% OP SOLN
OPHTHALMIC | Status: DC | PRN
  Administered 2017-11-24: 1 [drp]

## 2017-11-24 MED ORDER — TETRACAINE HCL 0.5 % OP SOLN
1.0000 [drp] | OPHTHALMIC | Status: DC | PRN
  Administered 2017-11-24: 1 [drp] via OPHTHALMIC

## 2017-11-24 MED ORDER — POLYMYXIN B-TRIMETHOPRIM 10000-0.1 UNIT/ML-% OP SOLN
OPHTHALMIC | Status: AC
  Filled 2017-11-24: qty 10

## 2017-11-24 MED ORDER — POLYMYXIN B-TRIMETHOPRIM 10000-0.1 UNIT/ML-% OP SOLN: 1.0000 [drp] | OPHTHALMIC | Status: AC

## 2017-11-24 MED ORDER — SODIUM CHLORIDE 0.9 % IV SOLN
INTRAVENOUS | Status: DC
  Administered 2017-11-24: 09:00:00 via INTRAVENOUS

## 2017-11-24 MED ORDER — CARBACHOL 0.01 % IO SOLN
INTRAOCULAR | Status: DC | PRN
  Administered 2017-11-24: 0.5 mL via INTRAOCULAR

## 2017-11-24 MED ORDER — NA CHONDROIT SULF-NA HYALURON 40-17 MG/ML IO SOLN
INTRAOCULAR | Status: AC
  Filled 2017-11-24: qty 1

## 2017-11-24 MED ORDER — EPINEPHRINE PF 1 MG/ML IJ SOLN
INTRAOCULAR | Status: DC | PRN
  Administered 2017-11-24: 10:00:00 via OPHTHALMIC

## 2017-11-24 MED ORDER — POVIDONE-IODINE 5 % OP SOLN
OPHTHALMIC | Status: DC | PRN
  Administered 2017-11-24: 1 via OPHTHALMIC

## 2017-11-24 MED ORDER — LIDOCAINE HCL (PF) 4 % IJ SOLN
INTRAOCULAR | Status: DC | PRN
  Administered 2017-11-24: 4 mL via OPHTHALMIC

## 2017-11-24 MED ORDER — ARMC OPHTHALMIC DILATING DROPS
1.0000 "application " | OPHTHALMIC | Status: AC
  Administered 2017-11-24 (×3): 1 via OPHTHALMIC

## 2017-11-24 MED ORDER — ARMC OPHTHALMIC DILATING DROPS
OPHTHALMIC | Status: AC
  Administered 2017-11-24: 1 via OPHTHALMIC
  Filled 2017-11-24: qty 0.5

## 2017-11-24 SURGICAL SUPPLY — 16 items
GLOVE BIO SURGEON STRL SZ8 (GLOVE) ×2 IMPLANT
GLOVE BIOGEL M 6.5 STRL (GLOVE) ×2 IMPLANT
GLOVE SURG LX 8.0 MICRO (GLOVE) ×1
GLOVE SURG LX STRL 8.0 MICRO (GLOVE) ×1 IMPLANT
GOWN STRL REUS W/ TWL LRG LVL3 (GOWN DISPOSABLE) ×2 IMPLANT
GOWN STRL REUS W/TWL LRG LVL3 (GOWN DISPOSABLE) ×2
LABEL CATARACT MEDS ST (LABEL) ×2 IMPLANT
LENS IOL TECNIS ITEC 15.5 (Intraocular Lens) ×2 IMPLANT
PACK CATARACT (MISCELLANEOUS) ×2 IMPLANT
PACK CATARACT BRASINGTON LX (MISCELLANEOUS) ×2 IMPLANT
PACK EYE AFTER SURG (MISCELLANEOUS) ×2 IMPLANT
SOL BSS BAG (MISCELLANEOUS) ×2
SOLUTION BSS BAG (MISCELLANEOUS) ×1 IMPLANT
SYR 5ML LL (SYRINGE) ×2 IMPLANT
WATER STERILE IRR 250ML POUR (IV SOLUTION) ×2 IMPLANT
WIPE NON LINTING 3.25X3.25 (MISCELLANEOUS) ×2 IMPLANT

## 2017-11-24 NOTE — Anesthesia Preprocedure Evaluation (Signed)
Anesthesia Evaluation  Patient identified by MRN, date of birth, ID band Patient awake    Reviewed: Allergy & Precautions, NPO status , Patient's Chart, lab work & pertinent test results, reviewed documented beta blocker date and time   Airway Mallampati: III  TM Distance: >3 FB     Dental  (+) Chipped   Pulmonary shortness of breath,           Cardiovascular hypertension, Pt. on medications + CAD  + dysrhythmias Atrial Fibrillation      Neuro/Psych    GI/Hepatic hiatal hernia,   Endo/Other    Renal/GU Renal disease     Musculoskeletal   Abdominal   Peds  Hematology   Anesthesia Other Findings Obese.  Reproductive/Obstetrics                             Anesthesia Physical Anesthesia Plan  ASA: III  Anesthesia Plan: MAC   Post-op Pain Management:    Induction:   PONV Risk Score and Plan:   Airway Management Planned:   Additional Equipment:   Intra-op Plan:   Post-operative Plan:   Informed Consent: I have reviewed the patients History and Physical, chart, labs and discussed the procedure including the risks, benefits and alternatives for the proposed anesthesia with the patient or authorized representative who has indicated his/her understanding and acceptance.     Plan Discussed with: CRNA  Anesthesia Plan Comments:         Anesthesia Quick Evaluation

## 2017-11-24 NOTE — Anesthesia Postprocedure Evaluation (Signed)
Anesthesia Post Note  Patient: Malik Wells  Procedure(s) Performed: CATARACT EXTRACTION PHACO AND INTRAOCULAR LENS PLACEMENT ([facility]) (Right Eye)  Patient location during evaluation: PACU Anesthesia Type: MAC Level of consciousness: awake and alert Pain management: pain level controlled Vital Signs Assessment: post-procedure vital signs reviewed and stable Respiratory status: spontaneous breathing, nonlabored ventilation, respiratory function stable and patient connected to nasal cannula oxygen Cardiovascular status: stable and blood pressure returned to baseline Postop Assessment: no apparent nausea or vomiting Anesthetic complications: no     Last Vitals:  Vitals:   11/24/17 0820 11/24/17 1003  BP: (!) 172/100 (!) 151/94  Pulse:  69  Resp:  18  Temp:  (!) 36.4 C  SpO2:  98%    Last Pain:  Vitals:   11/24/17 1003  TempSrc: Temporal  PainSc: 0-No pain                 Kaitlan Bin S

## 2017-11-24 NOTE — Discharge Instructions (Signed)
Eye Surgery Discharge Instructions    Expect mild scratchy sensation or mild soreness. DO NOT RUB YOUR EYE!  The day of surgery:  Minimal physical activity, but bed rest is not required  No reading, computer work, or close hand work  No bending, lifting, or straining.  May watch TV  For 24 hours:  No driving, legal decisions, or alcoholic beverages  Safety precautions  Eat anything you prefer: It is better to start with liquids, then soup then solid foods.  _____ Eye patch should be worn until postoperative exam tomorrow.  ____ Solar shield eyeglasses should be worn for comfort in the sunlight/patch while sleeping  Resume all regular medications including aspirin or Coumadin if these were discontinued prior to surgery. You may shower, bathe, shave, or wash your hair. Tylenol may be taken for mild discomfort.  Call your doctor if you experience significant pain, nausea, or vomiting, fever > 101 or other signs of infection. [phone] or [phone] Specific instructions:  Follow-up Information    Birder Robson, MD Follow up on 11/25/2017.   Specialty:  Ophthalmology Why:  appointment time @ 10:40 AM Contact information: [place] [address] [phone]

## 2017-11-24 NOTE — Anesthesia Procedure Notes (Signed)
Procedure Name: MAC Date/Time: 11/24/2017 9:47 AM Performed by: Johnna Acosta, CRNA Pre-anesthesia Checklist: Patient identified, Emergency Drugs available, Suction available, Patient being monitored and Timeout performed Patient Re-evaluated:Patient Re-evaluated prior to induction Oxygen Delivery Method: Nasal cannula

## 2017-11-24 NOTE — H&P (Signed)
All labs reviewed. Abnormal studies sent to patients PCP when indicated.  Previous H&P reviewed, patient examined, there are NO CHANGES.  Malik Yepes Porfilio11/12/20199:39 AM

## 2017-11-24 NOTE — Transfer of Care (Signed)
Immediate Anesthesia Transfer of Care Note  Patient: Malik Wells  Procedure(s) Performed: CATARACT EXTRACTION PHACO AND INTRAOCULAR LENS PLACEMENT ([place]) (Right Eye)  Patient Location: PACU  Anesthesia Type:MAC  Level of Consciousness: awake, alert  and oriented  Airway & Oxygen Therapy: Patient Spontanous Breathing  Post-op Assessment: Report given to RN and Post -op Vital signs reviewed and stable  Post vital signs: Reviewed and stable  Last Vitals:  Vitals Value Taken Time  BP 151/94 11/24/2017 10:03 AM  Temp 36.4 C 11/24/2017 10:03 AM  Pulse 69 11/24/2017 10:03 AM  Resp 18 11/24/2017 10:03 AM  SpO2 98 % 11/24/2017 10:03 AM    Last Pain:  Vitals:   11/24/17 1003  TempSrc: Temporal  PainSc: 0-No pain         Complications: No apparent anesthesia complications

## 2017-11-24 NOTE — Op Note (Signed)
PREOPERATIVE DIAGNOSIS:  Nuclear sclerotic cataract of the right eye.   POSTOPERATIVE DIAGNOSIS:  NUCLEAR SCLEROTIC CATARACT RIGHT EYE   OPERATIVE PROCEDURE: Procedure(s): CATARACT EXTRACTION PHACO AND INTRAOCULAR LENS PLACEMENT ([facility])   SURGEON:  Birder Robson, MD.   ANESTHESIA:  Anesthesiologist: Gunnar Bulla, MD CRNA: Johnna Acosta, CRNA  1.      Managed anesthesia care. 2.      0.7ml of Shugarcaine was instilled in the eye following the paracentesis.   COMPLICATIONS:  None.   TECHNIQUE:   Stop and chop   DESCRIPTION OF PROCEDURE:  The patient was examined and consented in the preoperative holding area where the aforementioned topical anesthesia was applied to the right eye and then brought back to the Operating Room where the right eye was prepped and draped in the usual sterile ophthalmic fashion and a lid speculum was placed. A paracentesis was created with the side port blade and the anterior chamber was filled with viscoelastic. A near clear corneal incision was performed with the steel keratome. A continuous curvilinear capsulorrhexis was performed with a cystotome followed by the capsulorrhexis forceps. Hydrodissection and hydrodelineation were carried out with BSS on a blunt cannula. The lens was removed in a stop and chop  technique and the remaining cortical material was removed with the irrigation-aspiration handpiece. The capsular bag was inflated with viscoelastic and the Technis [id number]  lens was placed in the capsular bag without complication. The remaining viscoelastic was removed from the eye with the irrigation-aspiration handpiece. The wounds were hydrated. The anterior chamber was flushed with Miostat and the eye was inflated to physiologic pressure.. The wounds were found to be water tight. The eye was dressed with Polytrim. The patient was given protective glasses to wear throughout the day and a shield with which to sleep tonight. The patient was also given  drops with which to begin a drop regimen today and will follow-up with me in one day. Implant Name Type Inv. Item Serial No. Manufacturer Lot No. LRB No. Used  LENS IOL DIOP 15.5 - [id number] Intraocular Lens LENS IOL DIOP 15.5 [id number]  Right 1   Procedure(s) with comments: CATARACT EXTRACTION PHACO AND INTRAOCULAR LENS PLACEMENT ([facility]) (Right) - [place] 00:48.4 CDE 6.22 Fluid Pack lot # [id number]  Electronically signed: Birder Robson 11/24/2017 10:04 AM

## 2017-11-24 NOTE — Anesthesia Post-op Follow-up Note (Signed)
Anesthesia QCDR form completed.        

## 2017-11-25 ENCOUNTER — Encounter: Payer: Self-pay | Admitting: Ophthalmology

## 2017-11-27 ENCOUNTER — Ambulatory Visit (INDEPENDENT_AMBULATORY_CARE_PROVIDER_SITE_OTHER): Payer: Medicare Other | Admitting: Urology

## 2017-11-27 ENCOUNTER — Encounter: Payer: Self-pay | Admitting: Urology

## 2017-11-27 DIAGNOSIS — N2889 Other specified disorders of kidney and ureter: Secondary | ICD-10-CM | POA: Diagnosis not present

## 2017-11-27 NOTE — Progress Notes (Signed)
11/27/2017 12:24 PM   Malik Wells 08-Jan-1935 [id number]  Referring provider: Kirk Ruths, MD [facility] [place], [place] [address]  Chief Complaint  Patient presents with  . Establish Care    HPI: 83 year old male presents to establish local urologic care.  He has been followed by Dr. Jacqlyn Larsen for greater than 10 years and last saw him in November 2018.  He has been followed for a complex renal cyst, BPH and gross hematuria thought secondary to a vascular malformation which was biopsied.  He was last seen in November 2018 for cystoscopy which showed no bladder mucosal abnormalities.  A CT urogram performed in April 2018 showed an indeterminant 18 mm right renal cystic lesion with solid enhancing components concerning for a cystic renal neoplasm.  Bilateral renal cysts were noted.  A renal ultrasound was performed in May 2019 and the right renal lesion was not adequately visualized.  He denies recurrent gross hematuria.  He has stable lower urinary tract symptoms on dutasteride which he takes twice weekly.  PMH: Past Medical History:  Diagnosis Date  . BPH (benign prostatic hyperplasia)   . Cancer ([place])    SKIN  . Cardiomyopathy ([place])   . Chest pain, unspecified   . Coronary artery disease   . Coronary artery ectasia ([place])   . Dyspnea    DOE  . Dysrhythmia   . Encounter for colonoscopy due to history of adenomatous colonic polyps   . History of BPH   . History of hiatal hernia   . HOH (hard of hearing)    AIDS  . Hypertension   . Kidney cyst, acquired   . PAF (paroxysmal atrial fibrillation) ([place])   . Ventral hernia     Surgical History: Past Surgical History:  Procedure Laterality Date  . APPENDECTOMY    . CARDIAC CATHETERIZATION     2008  . CATARACT EXTRACTION W/PHACO Left 10/27/2017   Procedure: CATARACT EXTRACTION PHACO AND INTRAOCULAR LENS PLACEMENT ([place]);  Surgeon: Birder Robson, MD;  Location: [facility] ORS;   Service: Ophthalmology;  Laterality: Left;  [place] 00:44 CDE 6.12 fluid pack lot # [id number]  . CATARACT EXTRACTION W/PHACO Right 11/24/2017   Procedure: CATARACT EXTRACTION PHACO AND INTRAOCULAR LENS PLACEMENT ([facility]);  Surgeon: Birder Robson, MD;  Location: [facility] ORS;  Service: Ophthalmology;  Laterality: Right;  [place] 00:48.4 CDE 6.22 Fluid Pack lot # [id number]  . CHOLECYSTECTOMY    . COLONOSCOPY WITH PROPOFOL N/A 05/27/2017   Procedure: COLONOSCOPY WITH PROPOFOL;  Surgeon: Manya Silvas, MD;  Location: [facility] ENDOSCOPY;  Service: Endoscopy;  Laterality: N/A;  . DENTAL SURGERY    . EYE SURGERY    . SEPTOPLASTY    . SKIN SURGERY     skin cancer surgery  . 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 TONSILLECTOMY      Home Medications:  Allergies as of 11/27/2017      Reactions   Levaquin [levofloxacin] Other (See Comments)   Altered Mental Status, Hallucinations   Penicillins Hives   Has patient had a PCN reaction causing immediate rash, facial/tongue/throat swelling, SOB or lightheadedness with hypotension: No Has patient had a PCN reaction causing severe rash involving mucus membranes or skin necrosis: Yes Has patient had a PCN reaction that required hospitalization: Yes Has patient had a PCN reaction occurring within the last 10 years: No If all of the above answers are "NO", then may proceed with Cephalosporin use.  Medication List        Accurate as of 11/27/17 12:24 PM. Always use your most recent med list.          acetaminophen 500 MG tablet Commonly known as:  TYLENOL Take 500 mg by mouth every 6 (six) hours as needed for moderate pain or headache.   amLODipine 5 MG tablet Commonly known as:  NORVASC Take 5 mg by mouth every evening.   aspirin EC 81 MG tablet Take 81 mg by mouth every evening.   atorvastatin 20 MG tablet Commonly known as:  LIPITOR Take 20 mg by mouth every evening.   carvedilol 3.125 MG tablet Commonly known  as:  COREG Take 1.5625 mg by mouth 2 (two) times daily with a meal.   clopidogrel 75 MG tablet Commonly known as:  PLAVIX Take 75 mg by mouth daily.   DUREZOL 0.05 % Emul Generic drug:  Difluprednate Place 1 drop into the left eye 2 (two) times daily.   dutasteride 0.5 MG capsule Commonly known as:  AVODART Take 0.5 mg by mouth 2 (two) times a week.   enalapril 20 MG tablet Commonly known as:  VASOTEC Take 20 mg by mouth 2 (two) times daily.   Glucosamine-Chondroitin 1500-1200 MG/30ML Liqd Take 15 mLs by mouth 2 (two) times daily.   ILEVRO 0.3 % ophthalmic suspension Generic drug:  nepafenac BEGINNING 2 DAYS BEFORE SURGERY USE 1 DROP ONCE A DAY & USE ONE DROP AM OF SURGERY IN OPERATIVE EYE.   imiquimod 5 % cream Commonly known as:  ALDARA Apply 1 application topically daily as needed (skin spots).   multivitamin tablet Take 1 tablet by mouth daily.   spironolactone 25 MG tablet Commonly known as:  ALDACTONE Take 12.5 mg by mouth daily.       Allergies:  Allergies  Allergen Reactions  . Levaquin [Levofloxacin] Other (See Comments)    Altered Mental Status, Hallucinations  . Penicillins Hives    Has patient had a PCN reaction causing immediate rash, facial/tongue/throat swelling, SOB or lightheadedness with hypotension: No Has patient had a PCN reaction causing severe rash involving mucus membranes or skin necrosis: Yes Has patient had a PCN reaction that required hospitalization: Yes Has patient had a PCN reaction occurring within the last 10 years: No If all of the above answers are "NO", then may proceed with Cephalosporin use.     Family History: Family History  Problem Relation Age of Onset  . Alzheimer's disease Mother   . Osteoarthritis Mother   . Hypertension Father   . Stroke Father   . Colon cancer Father   . Prostate cancer Father     Social History:  reports that he has never smoked. He has never used smokeless tobacco. He reports that he  drinks alcohol. He reports that he does not use drugs.  ROS: UROLOGY Frequent Urination?: No Hard to postpone urination?: Yes Burning/pain with urination?: No Get up at night to urinate?: Yes Leakage of urine?: No Urine stream starts and stops?: No Trouble starting stream?: No Do you have to strain to urinate?: No Blood in urine?: No Urinary tract infection?: No Sexually transmitted disease?: No Injury to kidneys or bladder?: No Painful intercourse?: No Weak stream?: No Erection problems?: No Penile pain?: No  Gastrointestinal Nausea?: No Vomiting?: No Indigestion/heartburn?: No Diarrhea?: No Constipation?: No  Constitutional Fever: No Night sweats?: No Weight loss?: No Fatigue?: No  Skin Skin rash/lesions?: No Itching?: No  Eyes Blurred vision?: No Double vision?: No  Ears/Nose/Throat Sore throat?: No Sinus problems?: No  Hematologic/Lymphatic Swollen glands?: No Easy bruising?: No  Cardiovascular Leg swelling?: No Chest pain?: No  Respiratory Cough?: No Shortness of breath?: No  Endocrine Excessive thirst?: No  Musculoskeletal Back pain?: No Joint pain?: No  Neurological Headaches?: No Dizziness?: No  Psychologic Depression?: No Anxiety?: No  Physical Exam: BP (!) 160/92 (BP Location: Left Arm, Patient Position: Sitting, Cuff Size: Large)   Pulse 76   Ht 5\' 10"  (1.778 m)   Wt 231 lb (104.8 kg)   BMI 33.15 kg/m   Constitutional:  Alert and oriented, No acute distress. HEENT: [place] AT, moist mucus membranes.  Trachea midline, no masses. Cardiovascular: No clubbing, cyanosis, or edema. Respiratory: Normal respiratory effort, no increased work of breathing. GI: Abdomen is soft, nontender, nondistended, no abdominal masses GU: No CVA tenderness.  Prostate 40 g, smooth without nodules Lymph: No cervical or inguinal lymphadenopathy. Skin: No rashes, bruises or suspicious lesions. Neurologic: Grossly intact, no focal deficits, moving all  4 extremities. Psychiatric: Normal mood and affect.   Assessment & Plan:   83 year old male with a 18 mm right renal lesion with enhancing solid components.  Have recommended a follow-up CT to assess for any interval change.  We did discuss possibility of a small renal cell carcinoma.  He has stable lower urinary tract symptoms on dutasteride.  He has had no recurrent gross hematuria.  He will be notified with his CT results and further recommendations.  Abbie Sons, [place] [address], [place], [place] [address] [phone]

## 2017-11-30 ENCOUNTER — Encounter: Payer: Self-pay | Admitting: Urology

## 2017-12-09 ENCOUNTER — Telehealth: Payer: Self-pay | Admitting: Urology

## 2017-12-09 NOTE — Telephone Encounter (Signed)
Wife called office to inform that she went to [facility] and picked up a copy of the CT requested. Wife also stated she has made appt for CT here at [facility] for Monday Dec 2.   Chart states pt will be notified of results.   FYI

## 2017-12-09 NOTE — Telephone Encounter (Signed)
Noted, thanks!

## 2017-12-14 ENCOUNTER — Ambulatory Visit (HOSPITAL_COMMUNITY): Payer: Medicare Other

## 2017-12-14 ENCOUNTER — Other Ambulatory Visit: Payer: Self-pay | Admitting: Urology

## 2017-12-14 ENCOUNTER — Ambulatory Visit
Admission: RE | Admit: 2017-12-14 | Discharge: 2017-12-14 | Disposition: A | Discharge status: Home / Self Care | Payer: Medicare Other | Source: Ambulatory Visit | Attending: Urology | Admitting: Urology

## 2017-12-14 ENCOUNTER — Ambulatory Visit
Admission: RE | Admit: 2017-12-14 | Discharge: 2017-12-14 | Disposition: A | Discharge status: Home / Self Care | Payer: Self-pay | Source: Ambulatory Visit | Attending: Urology | Admitting: Urology

## 2017-12-14 DIAGNOSIS — N2889 Other specified disorders of kidney and ureter: Secondary | ICD-10-CM

## 2017-12-14 LAB — POCT I-STAT CREATININE: Creatinine, Ser: 1 mg/dL (ref 0.61–1.24)

## 2017-12-14 MED ORDER — IOHEXOL 300 MG/ML  SOLN
100.0000 mL | Freq: Once | INTRAMUSCULAR | Status: AC | PRN
  Administered 2017-12-14: 100 mL via INTRAVENOUS

## 2017-12-17 ENCOUNTER — Telehealth: Payer: Self-pay | Admitting: Urology

## 2017-12-17 NOTE — Telephone Encounter (Signed)
I spoke with Mr. and Malik Wells regarding his recent renal CT.  A prior CT April 2018 showed a 1.8 cm indeterminate mass with solid enhancing components suspicious for a cystic renal neoplasm.  Follow-up CT performed earlier this week showed a 2 cm right renal mass with solid enhancing components felt suspicious for a small renal cell carcinoma.  He also has bilateral Bosniak 1/2 renal cysts.  I informed them there is an approximately 75% chance this is a small kidney cancer and there has only been slight size increase.  Continue surveillance is an acceptable option.  It was my opinion that the risks of surgical excision of this mass outweigh the benefits.  Renal mass biopsy and percutaneous ablation by either RFA or cryoablation by interventional radiology was also discussed.  They would like to think over these options and will call back with their decision.

## 2017-12-17 NOTE — Telephone Encounter (Signed)
Pt and pt wife both lmom on vm stating they were told that they would be notified of CT results.  CT was done on Monday 12/02, CT done at [facility] has been dropped off at the office, pt and wife both expressing they want a call today on their cell number 1st and if no answer on cell to please call home number, neither of them left call back numbers on message. Please advise. Thank you.

## 2018-01-01 ENCOUNTER — Telehealth: Payer: Self-pay | Admitting: Urology

## 2018-01-01 ENCOUNTER — Encounter: Payer: Self-pay | Admitting: Urology

## 2018-01-01 NOTE — Telephone Encounter (Signed)
-----   Message from Abbie Sons, MD sent at 01/01/2018  7:50 AM EST ----- Regarding: Letter Please print the letter I dictated today and mail to patient.  Thanks

## 2018-01-01 NOTE — Telephone Encounter (Signed)
Letter printed and mailed  Malik Wells

## 2019-09-23 ENCOUNTER — Encounter: Payer: Medicare Other | Attending: Family Medicine | Admitting: *Deleted

## 2019-09-23 ENCOUNTER — Encounter: Payer: Self-pay | Admitting: *Deleted

## 2019-09-23 ENCOUNTER — Other Ambulatory Visit: Payer: Self-pay

## 2019-09-23 VITALS — BP 128/78 | Ht 70.0 in | Wt 212.4 lb

## 2019-09-23 DIAGNOSIS — E119 Type 2 diabetes mellitus without complications: Secondary | ICD-10-CM | POA: Diagnosis present

## 2019-09-23 NOTE — Progress Notes (Signed)
Diabetes Self-Management Education  Visit Type: First/Initial  Appt. Start Time: 1030 Appt. End Time: 1200  09/23/2019  [name], identified by name and date of birth, is a 85 y.o. male with a diagnosis of Diabetes: Type 2.   ASSESSMENT  Blood pressure 128/78, height 5\' 10"  (1.778 m), weight 212 lb 6.4 oz (96.3 kg). Body mass index is 30.48 kg/m.   Diabetes Self-Management Education - 09/23/19 1322      Visit Information   Visit Type First/Initial      Initial Visit   Diabetes Type Type 2    Are you currently following a meal plan? Yes    What type of meal plan do you follow? wife is label reading    Are you taking your medications as prescribed? Yes    Date Diagnosed Aug 2021      Health Coping   How would you rate your overall health? Good      Psychosocial Assessment   Patient Belief/Attitude about Diabetes Motivated to manage diabetes   "I don't like it"   Self-care barriers None    Self-management support Doctor's office;Family    Other persons present Spouse/SO    Patient Concerns Glycemic Control;Nutrition/Meal planning;Medication;Monitoring;Weight Control;Healthy Lifestyle    Special Needs None    Preferred Learning Style Auditory;Visual;Hands on    [place] in progress    How often do you need to have someone help you when you read instructions, pamphlets, or other written materials from your doctor or pharmacy? 1 - Never    What is the last grade level you completed in school? high school      Pre-Education Assessment   Patient understands the diabetes disease and treatment process. Needs Instruction    Patient understands incorporating nutritional management into lifestyle. Needs Instruction    Patient undertands incorporating physical activity into lifestyle. Needs Instruction    Patient understands using medications safely. Needs Instruction    Patient understands monitoring blood glucose, interpreting and using results Needs  Instruction    Patient understands prevention, detection, and treatment of acute complications. Needs Instruction    Patient understands prevention, detection, and treatment of chronic complications. Needs Instruction    Patient understands how to develop strategies to address psychosocial issues. Needs Instruction    Patient understands how to develop strategies to promote health/change behavior. Needs Instruction      Complications   Last HgB A1C per patient/outside source 7.1 %   08/15/2019   How often do you check your blood sugar? Patient declines   Blood sugar in the office was 67 mg/dL at 11:40 am - 3 1/2 hrs after eating cereal and milk. He denied symptoms of hypoglycemia. Provided 2 peanut butter crackers as patient was leaving appointment to eat lunch.   Have you had a dilated eye exam in the past 12 months? Yes    Have you had a dental exam in the past 12 months? No   dentures   Are you checking your feet? No      Dietary Intake   Breakfast cereal and milk; eggs; grits or oatmeal; pork chop, egg and cheese biscuit    Lunch tuna fish sandwich - 1 piece of brea; roast beef; egg salad or peanut butter and jelly sandwich, fruit cups, cantaloupe, berries    Snack (afternoon) breakfast bar; peanut butter    Dinner chicken, beef, pork, fish; potatoes, occasional peas and corn, pinto beans, green beans, rice, occasional spaghetti; broccoli, cauliflower, tomatoes, carrots, cuccumbers,  zucchini, squash, onions, peppers    Snack (evening) peanut butter    Beverage(s) water, lite juice, coffee, diet soda      Exercise   Exercise Type ADL's      Patient Education   Previous Diabetes Education No    Disease state  Definition of diabetes, type 1 and 2, and the diagnosis of diabetes;Factors that contribute to the development of diabetes    Nutrition management  Role of diet in the treatment of diabetes and the relationship between the three main macronutrients and blood glucose level;Food label  reading, portion sizes and measuring food.;Carbohydrate counting    Physical activity and exercise  Role of exercise on diabetes management, blood pressure control and cardiac health.    Monitoring Identified appropriate SMBG and/or A1C goals.    Acute complications Taught treatment of hypoglycemia - the 15 rule.    Chronic complications Relationship between chronic complications and blood glucose control    Psychosocial adjustment Identified and addressed patients feelings and concerns about diabetes      Individualized Goals (developed by patient)   Reducing Risk Other (comment)   improve blood sugars, decrease medications, prevent diabetes complications, lose weight, lead a healthier lifestyle     Outcomes   Expected Outcomes Demonstrated interest in learning. Expect positive outcomes           Individualized Plan for Diabetes Self-Management Training:   Learning Objective:  Patient will have a greater understanding of diabetes self-management. Patient education plan is to attend individual and/or group sessions per assessed needs and concerns.   Plan:   Patient Instructions  Exercise: Walk as tolerated Eat 3 meals day,  1-2  snacks a day Space meals 4-6 hours apart Include 1 serving of protein with each meal Carry a snack at all times Call back to schedule Diabetes classes  Expected Outcomes:  Demonstrated interest in learning. Expect positive outcomes  Education material provided:  General Meal Planning Guidelines Simple Meal Plan  If problems or questions, patient to contact team via:  Malik Drilling, RN, CCM, [place] [phone]  Future DSME appointment:  Patient and wife were provided the schedule for Diabetes classes for the rest of the year. They will call back to schedule classes or an individual appointment with the nurse or dietitian.

## 2019-09-23 NOTE — Patient Instructions (Signed)
Exercise: Walk as tolerated  Eat 3 meals day,  1-2  snacks a day Space meals 4-6 hours apart Include 1 serving of protein with each meal  Carry a snack at all times  Call back to schedule Diabetes classes

## 2019-10-27 DIAGNOSIS — I5020 Unspecified systolic (congestive) heart failure: Secondary | ICD-10-CM | POA: Diagnosis present

## 2019-11-02 ENCOUNTER — Encounter: Payer: Self-pay | Admitting: *Deleted

## 2020-05-06 ENCOUNTER — Other Ambulatory Visit: Payer: Self-pay

## 2020-05-06 ENCOUNTER — Emergency Department: Payer: Medicare Other

## 2020-05-06 ENCOUNTER — Observation Stay
Admission: EM | Admit: 2020-05-06 | Discharge: 2020-05-07 | Disposition: A | Discharge status: Home / Self Care | Payer: Medicare Other | Attending: Internal Medicine | Admitting: Internal Medicine

## 2020-05-06 DIAGNOSIS — R0789 Other chest pain: Principal | ICD-10-CM | POA: Insufficient documentation

## 2020-05-06 DIAGNOSIS — Q6100 Congenital renal cyst, unspecified: Secondary | ICD-10-CM | POA: Diagnosis not present

## 2020-05-06 DIAGNOSIS — Z20822 Contact with and (suspected) exposure to covid-19: Secondary | ICD-10-CM | POA: Diagnosis not present

## 2020-05-06 DIAGNOSIS — I5020 Unspecified systolic (congestive) heart failure: Secondary | ICD-10-CM | POA: Diagnosis present

## 2020-05-06 DIAGNOSIS — Z7982 Long term (current) use of aspirin: Secondary | ICD-10-CM | POA: Diagnosis not present

## 2020-05-06 DIAGNOSIS — I2510 Atherosclerotic heart disease of native coronary artery without angina pectoris: Secondary | ICD-10-CM | POA: Diagnosis not present

## 2020-05-06 DIAGNOSIS — I10 Essential (primary) hypertension: Secondary | ICD-10-CM | POA: Diagnosis present

## 2020-05-06 DIAGNOSIS — Z7902 Long term (current) use of antithrombotics/antiplatelets: Secondary | ICD-10-CM | POA: Insufficient documentation

## 2020-05-06 DIAGNOSIS — R079 Chest pain, unspecified: Secondary | ICD-10-CM | POA: Diagnosis present

## 2020-05-06 DIAGNOSIS — E119 Type 2 diabetes mellitus without complications: Secondary | ICD-10-CM | POA: Diagnosis not present

## 2020-05-06 DIAGNOSIS — I7789 Other specified disorders of arteries and arterioles: Secondary | ICD-10-CM

## 2020-05-06 DIAGNOSIS — E782 Mixed hyperlipidemia: Secondary | ICD-10-CM | POA: Diagnosis present

## 2020-05-06 DIAGNOSIS — Z85828 Personal history of other malignant neoplasm of skin: Secondary | ICD-10-CM | POA: Insufficient documentation

## 2020-05-06 DIAGNOSIS — Z79899 Other long term (current) drug therapy: Secondary | ICD-10-CM | POA: Insufficient documentation

## 2020-05-06 DIAGNOSIS — Q619 Cystic kidney disease, unspecified: Secondary | ICD-10-CM

## 2020-05-06 DIAGNOSIS — I110 Hypertensive heart disease with heart failure: Secondary | ICD-10-CM | POA: Insufficient documentation

## 2020-05-06 DIAGNOSIS — I420 Dilated cardiomyopathy: Secondary | ICD-10-CM | POA: Diagnosis present

## 2020-05-06 MED ORDER — MORPHINE SULFATE (PF) 2 MG/ML IV SOLN
2.0000 mg | Freq: Once | INTRAVENOUS | Status: AC
  Administered 2020-05-06: 2 mg via INTRAVENOUS
  Filled 2020-05-06: qty 1

## 2020-05-06 NOTE — ED Provider Notes (Signed)
[facility] Emergency Department Provider Note  ____________________________________________   Event Date/Time   First MD Initiated Contact with Patient 05/06/20 2327     (approximate)  I have reviewed the triage vital signs and the nursing notes.   HISTORY  Chief Complaint Chest Pain (epigastric)    HPI Malik Wells is a 86 y.o. male with history of cardiomyopathy, hypertension, hyperlipidemia, paroxysmal atrial fibrillation who presents to the emergency department EMS for complaints of lower chest, upper abdominal pain that he describes as a pressure that started about 1 hour prior to arrival while at rest.  No radiation of pain.  No associated shortness of breath, nausea, vomiting, diaphoresis, dizziness.  No fever or cough.  No lower extremity swelling or pain.  No aggravating factors.  Symptoms improved after aspirin and 3 nitroglycerin with EMS.  Pain went from a 5/10 down to a 1/10.  Last cardiac catheterization was at [facility] in 2008.  Denies having any stents.  Status postcholecystectomy, appendectomy.  Reports he ate a salad for dinner.        Past Medical History:  Diagnosis Date  . BPH (benign prostatic hyperplasia)   . Cancer ([place])    SKIN  . Cardiomyopathy ([place])   . Chest pain, unspecified   . Coronary artery disease   . 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 Diabetes mellitus without complication ([place])   . Dyspnea    DOE  . Dysrhythmia   . Encounter for colonoscopy due to history of adenomatous colonic polyps   . History of BPH   . History of hiatal hernia   . HOH (hard of hearing)    AIDS  . Hypertension   . Kidney cyst, acquired   . PAF (paroxysmal atrial fibrillation) ([place])   . Ventral hernia     Patient Active Problem List   Diagnosis Date Noted  . Chest pain 05/07/2020  . HFrEF (heart failure with reduced ejection fraction) ([place]) 10/27/2019  . Coronary arteriosclerosis in native artery 11/11/2011  . Essential  hypertension 05/12/2011  . Nonischemic dilated cardiomyopathy ([place]) 05/12/2011    Past Surgical History:  Procedure Laterality Date  . APPENDECTOMY    . CARDIAC CATHETERIZATION     2008  . CATARACT EXTRACTION W/PHACO Left 10/27/2017   Procedure: CATARACT EXTRACTION PHACO AND INTRAOCULAR LENS PLACEMENT ([place]);  Surgeon: Birder Robson, MD;  Location: [facility] ORS;  Service: Ophthalmology;  Laterality: Left;  [place] 00:44 CDE 6.12 fluid pack lot # [id number]  . CATARACT EXTRACTION W/PHACO Right 11/24/2017   Procedure: CATARACT EXTRACTION PHACO AND INTRAOCULAR LENS PLACEMENT ([facility]);  Surgeon: Birder Robson, MD;  Location: [facility] ORS;  Service: Ophthalmology;  Laterality: Right;  [place] 00:48.4 CDE 6.22 Fluid Pack lot # [id number]  . CHOLECYSTECTOMY    . COLONOSCOPY WITH PROPOFOL N/A 05/27/2017   Procedure: COLONOSCOPY WITH PROPOFOL;  Surgeon: Manya Silvas, MD;  Location: [facility] ENDOSCOPY;  Service: Endoscopy;  Laterality: N/A;  . DENTAL SURGERY    . EYE SURGERY    . SEPTOPLASTY    . SKIN SURGERY     skin cancer surgery  . 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 TONSILLECTOMY      Prior to Admission medications   Medication Sig Start Date End Date Taking? Authorizing Provider  acetaminophen (TYLENOL) 500 MG tablet Take 500 mg by mouth every 6 (six) hours as needed for moderate pain or headache.    [provider]  amLODipine ([place])  5 MG tablet Take 5 mg by mouth every evening.     [provider]  ascorbic acid (VITAMIN C) 500 MG tablet Take 500 mg by mouth daily.    [provider]  aspirin EC 81 MG tablet Take 81 mg by mouth every evening.     [provider]  atorvastatin (LIPITOR) 20 MG tablet Take 20 mg by mouth every evening.     [provider]  carvedilol (COREG) 3.125 MG tablet Take 1.5625 mg by mouth 2 (two) times daily with a meal.     [provider]  clopidogrel (PLAVIX) 75 MG tablet Take 75 mg by  mouth daily.    [provider]  enalapril (VASOTEC) 20 MG tablet Take 20 mg by mouth 2 (two) times daily.    [provider]  Glucosamine-Chondroitin 1500-1200 MG/30ML LIQD Take 15 mLs by mouth 2 (two) times daily.    [provider]  Multiple Vitamin (MULTIVITAMIN) tablet Take 1 tablet by mouth daily.    [provider]  spironolactone (ALDACTONE) 25 MG tablet Take 12.5 mg by mouth daily.    [provider]    Allergies Levaquin [levofloxacin] and Penicillins  Family History  Problem Relation Age of Onset  . Alzheimer's disease Mother   . Osteoarthritis Mother   . Hypertension Father   . Stroke Father   . Colon cancer Father   . Prostate cancer Father     Social History Social History   Tobacco Use  . Smoking status: Never Smoker  . Smokeless tobacco: Never Used  Vaping Use  . Vaping Use: Never used  Substance Use Topics  . Alcohol use: Not Currently    Comment: occas  . Drug use: No    Review of Systems Constitutional: No fever. Eyes: No visual changes. ENT: No sore throat. Cardiovascular: + chest pain. Respiratory: Denies shortness of breath. Gastrointestinal: No nausea, vomiting, diarrhea. Genitourinary: Negative for dysuria. Musculoskeletal: Negative for back pain. Skin: Negative for rash. Neurological: Negative for focal weakness or numbness.  ____________________________________________   PHYSICAL EXAM:  VITAL SIGNS: ED Triage Vitals  Enc Vitals Group     BP 05/06/20 2325 (!) 157/70     Pulse Rate 05/06/20 2325 93     Resp 05/06/20 2325 12     Temp 05/06/20 2330 98.1 F (36.7 C)     Temp Source 05/06/20 2330 Oral     SpO2 05/06/20 2325 97 %     Weight --      Height --      Head Circumference --      Peak Flow --      Pain Score --      Pain Loc --      Pain Edu? --      Excl. in [place]? --    CONSTITUTIONAL: Alert and oriented and responds appropriately to questions. Well-appearing; well-nourished,  elderly, in no distress HEAD: Normocephalic EYES: Conjunctivae clear, pupils appear equal, EOM appear intact ENT: normal nose; moist mucous membranes NECK: Supple, normal ROM CARD: RRR; S1 and S2 appreciated; no murmurs, no clicks, no rubs, no gallops RESP: Normal chest excursion without splinting or tachypnea; breath sounds clear and equal bilaterally; no wheezes, no rhonchi, no rales, no hypoxia or respiratory distress, speaking full sentences ABD/GI: Normal bowel sounds; non-distended; soft, non-tender, no rebound, no guarding, no peritoneal signs, no hepatosplenomegaly BACK: The back appears normal EXT: Normal ROM in all joints; no deformity noted, no edema; no cyanosis, no  calf tenderness or calf swelling SKIN: Normal color for age and race; warm; no rash on exposed skin NEURO: Moves all extremities equally PSYCH: The patient's mood and manner are appropriate.  ____________________________________________   LABS (all labs ordered are listed, but only abnormal results are displayed)  Labs Reviewed  CBC WITH DIFFERENTIAL/PLATELET - Abnormal; Notable for the following components:      Result Value   Hemoglobin 12.6 (*)    HCT 38.7 (*)    All other components within normal limits  COMPREHENSIVE METABOLIC PANEL - Abnormal; Notable for the following components:   Glucose, Bld 129 (*)    BUN 29 (*)    Calcium 8.8 (*)    Total Protein 6.4 (*)    Total Bilirubin 1.3 (*)    All other components within normal limits  BRAIN NATRIURETIC PEPTIDE - Abnormal; Notable for the following components:   B Natriuretic Peptide 586.3 (*)    All other components within normal limits  TROPONIN I (HIGH SENSITIVITY) - Abnormal; Notable for the following components:   Troponin I (High Sensitivity) 23 (*)    All other components within normal limits  TROPONIN I (HIGH SENSITIVITY) - Abnormal; Notable for the following components:   Troponin I (High Sensitivity) 32 (*)    All other components within  normal limits  RESP PANEL BY RT-PCR (FLU A&B, COVID) ARPGX2  LIPASE, BLOOD  MAGNESIUM   ____________________________________________  EKG   EKG Interpretation  Date/Time:  Sunday May 06 2020 23:37:53 EDT Ventricular Rate:  80 PR Interval:  202 QRS Duration: 160 QT Interval:  423 QTC Calculation: 488 R Axis:   -34 Text Interpretation: Sinus rhythm Ventricular bigeminy Right bundle branch block Confirmed by Finesse Fielder, Cyril Mourning [phone]) on 05/06/2020 11:49:01 PM       EKG Interpretation  Date/Time:  Sunday May 06 2020 23:37:53 EDT Ventricular Rate:  80 PR Interval:  202 QRS Duration: 160 QT Interval:  423 QTC Calculation: 488 R Axis:   -34 Text Interpretation: Sinus rhythm Ventricular bigeminy Right bundle branch block Confirmed by Pryor Curia [phone]) on 05/06/2020 11:49:01 PM       ____________________________________________  RADIOLOGY Jessie Foot Olamae Ferrara, personally viewed and evaluated these images (plain radiographs) as part of my medical decision making, as well as reviewing the written report by the radiologist.  ED MD interpretation: Chest x-ray clear.  Official radiology report(s): DG Chest 2 View  Result Date: 05/07/2020 CLINICAL DATA:  Chest pain starting in our ago. EXAM: CHEST - 2 VIEW COMPARISON:  11/17/2015 FINDINGS: Shallow inspiration. Heart size and pulmonary vascularity are likely normal for inspiratory effort. No airspace disease or consolidation in the lungs. No pleural effusions. No pneumothorax. Mediastinal contours appear intact. Degenerative changes in the spine and shoulders. IMPRESSION: Shallow inspiration. No evidence of active pulmonary disease. Electronically Signed   By: Lucienne Capers M.D.   On: 05/07/2020 00:51    ____________________________________________   PROCEDURES  Procedure(s) performed (including Critical Care):  Procedures    ____________________________________________   INITIAL IMPRESSION / ASSESSMENT AND PLAN / ED  COURSE  As part of my medical decision making, I reviewed the following data within the [place] History obtained from family, Nursing notes reviewed and incorporated, Labs reviewed , EKG interpreted , Old EKG reviewed, Old chart reviewed, Radiograph reviewed , Discussed with admitting physician  and Notes from prior ED visits         Patient here with complaints of chest pain.  Differential includes ACS, PE, dissection, gastritis,  GERD, pancreatitis, biliary colic.  Will obtain labs, chest x-ray.  Will give morphine for his continued discomfort.  EKG shows no new ischemic change compared to previous but does show frequent PVCs.  We will check electrolytes.  ED PROGRESS  Patient's labs unremarkable.  Initial troponin 23.  Normal electrolytes.  Repeat EKG shows no ischemic change.  Chest x-ray clear.  Plan to obtain second troponin.  Currently pain-free after morphine.  LFTs, lipase normal.  No leukocytosis.  3:00 AM  Pt's chest pain is still gone.  Second troponin has gone up slightly from 23 now up to 32.  I recommended given his history and symptoms that he be admitted to the hospital for observation for chest pain rule out.  Daughter who is a Marine scientist agrees with this plan.  Will discuss with hospitalist.  3:07 AM Discussed patient's case with hospitalist, Dr. Damita Dunnings.  I have recommended admission and patient (and family if present) agree with this plan. Admitting physician will place admission orders.   I reviewed all nursing notes, vitals, pertinent previous records and reviewed/interpreted all EKGs, lab and urine results, imaging (as available).   ____________________________________________   FINAL CLINICAL IMPRESSION(S) / ED DIAGNOSES  Final diagnoses:  Nonspecific chest pain     ED Discharge Orders    None      *Please note:  Malik Wells was evaluated in Emergency Department on 05/07/2020 for the symptoms described in the history of present  illness. He was evaluated in the context of the global COVID-19 pandemic, which necessitated consideration that the patient might be at risk for infection with the SARS-CoV-2 virus that causes COVID-19. Institutional protocols and algorithms that pertain to the evaluation of patients at risk for COVID-19 are in a state of rapid change based on information released by regulatory bodies including the CDC and federal and state organizations. These policies and algorithms were followed during the patient's care in the ED.  Some ED evaluations and interventions may be delayed as a result of limited staffing during and the pandemic.*   Note:  This document was prepared using Dragon voice recognition software and may include unintentional dictation errors.   Willona Phariss, Delice Bison, DO 05/07/20 [phone]

## 2020-05-06 NOTE — ED Triage Notes (Signed)
Pt presents via EMS for c/o chest pain starting approx 1 hr PTA. Pt sts he was sitting when pain started. Denies SOB, nausea, dizziness, heartburn, gas, or SOB.   EMS gave ASA and nitro x 3 en route and pt's pain improved from 5/10 to 1/10.

## 2020-05-07 ENCOUNTER — Encounter: Payer: Self-pay | Admitting: Radiology

## 2020-05-07 ENCOUNTER — Emergency Department: Payer: Medicare Other

## 2020-05-07 DIAGNOSIS — I208 Other forms of angina pectoris: Secondary | ICD-10-CM | POA: Diagnosis not present

## 2020-05-07 DIAGNOSIS — I7789 Other specified disorders of arteries and arterioles: Secondary | ICD-10-CM

## 2020-05-07 DIAGNOSIS — R0789 Other chest pain: Secondary | ICD-10-CM | POA: Diagnosis not present

## 2020-05-07 DIAGNOSIS — R079 Chest pain, unspecified: Secondary | ICD-10-CM | POA: Diagnosis present

## 2020-05-07 LAB — BRAIN NATRIURETIC PEPTIDE: B Natriuretic Peptide: 586.3 pg/mL — ABNORMAL HIGH (ref 0.0–100.0)

## 2020-05-07 LAB — CREATININE, SERUM
Creatinine, Ser: 0.9 mg/dL (ref 0.61–1.24)
GFR, Estimated: >60 mL/min (ref >60)

## 2020-05-07 LAB — COMPREHENSIVE METABOLIC PANEL
ALT: 18 U/L (ref 0–44)
AST: 23 U/L (ref 15–41)
Albumin: 3.9 g/dL (ref 3.5–5.0)
Alkaline Phosphatase: 59 U/L (ref 38–126)
Anion gap: 8 (ref 5–15)
BUN: 29 mg/dL — ABNORMAL HIGH (ref 8–23)
CO2: 25 mmol/L (ref 22–32)
Calcium: 8.8 mg/dL — ABNORMAL LOW (ref 8.9–10.3)
Chloride: 106 mmol/L (ref 98–111)
Creatinine, Ser: 0.95 mg/dL (ref 0.61–1.24)
GFR, Estimated: >60 mL/min (ref >60)
Glucose, Bld: 129 mg/dL — ABNORMAL HIGH (ref 70–99)
Potassium: 4.2 mmol/L (ref 3.5–5.1)
Sodium: 139 mmol/L (ref 135–145)
Total Bilirubin: 1.3 mg/dL — ABNORMAL HIGH (ref 0.3–1.2)
Total Protein: 6.4 g/dL — ABNORMAL LOW (ref 6.5–8.1)

## 2020-05-07 LAB — CBC
HCT: 39.6 % (ref 39.0–52.0)
Hemoglobin: 13 g/dL (ref 13.0–17.0)
MCH: 27.9 pg (ref 26.0–34.0)
MCHC: 32.8 g/dL (ref 30.0–36.0)
MCV: 85 fL (ref 80.0–100.0)
Platelets: 181 10*3/uL (ref 150–400)
RBC: 4.66 MIL/uL (ref 4.22–5.81)
RDW: 15.5 % (ref 11.5–15.5)
WBC: 4.7 10*3/uL (ref 4.0–10.5)
nRBC: 0 % (ref 0.0–0.2)

## 2020-05-07 LAB — TROPONIN I (HIGH SENSITIVITY)
Troponin I (High Sensitivity): 23 ng/L — ABNORMAL HIGH (ref <18)
Troponin I (High Sensitivity): 28 ng/L — ABNORMAL HIGH (ref <18)
Troponin I (High Sensitivity): 32 ng/L — ABNORMAL HIGH (ref <18)

## 2020-05-07 LAB — CBC WITH DIFFERENTIAL/PLATELET
Abs Immature Granulocytes: 0.02 10*3/uL (ref 0.00–0.07)
Basophils Absolute: 0.1 10*3/uL (ref 0.0–0.1)
Basophils Relative: 1 %
Eosinophils Absolute: 0.2 10*3/uL (ref 0.0–0.5)
Eosinophils Relative: 4 %
HCT: 38.7 % — ABNORMAL LOW (ref 39.0–52.0)
Hemoglobin: 12.6 g/dL — ABNORMAL LOW (ref 13.0–17.0)
Immature Granulocytes: 0 %
Lymphocytes Relative: 21 %
Lymphs Abs: 1 10*3/uL (ref 0.7–4.0)
MCH: 28.2 pg (ref 26.0–34.0)
MCHC: 32.6 g/dL (ref 30.0–36.0)
MCV: 86.6 fL (ref 80.0–100.0)
Monocytes Absolute: 0.3 10*3/uL (ref 0.1–1.0)
Monocytes Relative: 7 %
Neutro Abs: 3.4 10*3/uL (ref 1.7–7.7)
Neutrophils Relative %: 67 %
Platelets: 172 10*3/uL (ref 150–400)
RBC: 4.47 MIL/uL (ref 4.22–5.81)
RDW: 15.4 % (ref 11.5–15.5)
WBC: 5 10*3/uL (ref 4.0–10.5)
nRBC: 0 % (ref 0.0–0.2)

## 2020-05-07 LAB — RESP PANEL BY RT-PCR (FLU A&B, COVID) ARPGX2
Influenza A by PCR: NEGATIVE
Influenza B by PCR: NEGATIVE
SARS Coronavirus 2 by RT PCR: NEGATIVE

## 2020-05-07 LAB — LIPASE, BLOOD: Lipase: 34 U/L (ref 11–51)

## 2020-05-07 LAB — MAGNESIUM: Magnesium: 2.1 mg/dL (ref 1.7–2.4)

## 2020-05-07 MED ORDER — AMLODIPINE BESYLATE 5 MG PO TABS: 5.0000 mg | ORAL_TABLET | Freq: Every evening | ORAL | Status: DC

## 2020-05-07 MED ORDER — ATORVASTATIN CALCIUM 20 MG PO TABS: 40.0000 mg | ORAL_TABLET | Freq: Every day | ORAL | Status: DC

## 2020-05-07 MED ORDER — CARVEDILOL 6.25 MG PO TABS
6.2500 mg | ORAL_TABLET | Freq: Two times a day (BID) | ORAL | Status: DC
  Administered 2020-05-07: 6.25 mg via ORAL
  Filled 2020-05-07: qty 1

## 2020-05-07 MED ORDER — NITROGLYCERIN 0.4 MG SL SUBL: 0.4000 mg | SUBLINGUAL_TABLET | SUBLINGUAL | 3 refills | Status: AC | PRN

## 2020-05-07 MED ORDER — SPIRONOLACTONE 25 MG PO TABS
12.5000 mg | ORAL_TABLET | Freq: Every day | ORAL | Status: DC
  Administered 2020-05-07: 12.5 mg via ORAL
  Filled 2020-05-07: qty 0.5

## 2020-05-07 MED ORDER — ENOXAPARIN SODIUM 40 MG/0.4ML ~~LOC~~ SOLN
40.0000 mg | SUBCUTANEOUS | Status: DC
  Administered 2020-05-07: 40 mg via SUBCUTANEOUS
  Filled 2020-05-07: qty 0.4

## 2020-05-07 MED ORDER — ONDANSETRON HCL 4 MG/2ML IJ SOLN: 4.0000 mg | Freq: Four times a day (QID) | INTRAMUSCULAR | Status: DC | PRN

## 2020-05-07 MED ORDER — NITROGLYCERIN 0.4 MG SL SUBL: 0.4000 mg | SUBLINGUAL_TABLET | SUBLINGUAL | Status: DC | PRN

## 2020-05-07 MED ORDER — ACETAMINOPHEN 325 MG PO TABS: 650.0000 mg | ORAL_TABLET | ORAL | Status: DC | PRN

## 2020-05-07 MED ORDER — ENALAPRIL MALEATE 10 MG PO TABS
20.0000 mg | ORAL_TABLET | Freq: Two times a day (BID) | ORAL | Status: DC
  Administered 2020-05-07: 20 mg via ORAL
  Filled 2020-05-07: qty 2

## 2020-05-07 MED ORDER — ASPIRIN EC 81 MG PO TBEC: 81.0000 mg | DELAYED_RELEASE_TABLET | Freq: Every day | ORAL | Status: DC

## 2020-05-07 NOTE — ED Notes (Signed)
Report to [place], rn.

## 2020-05-07 NOTE — Consult Note (Signed)
[facility] Consultation Note  Patient ID: Malik Wells, MRN: [id number], DOB/AGE: [birth year] 84 y.o. Admit date: 05/06/2020   Date of Consult: 05/07/2020 Primary Physician: Donnamarie Rossetti, PA-C Primary Cardiologist: [facility]  Chief Complaint:  Chief Complaint  Patient presents with  . Chest Pain    epigastric   Reason for Consult: HPI    Chest Pain     Additional comments: epigastric       Last edited by Antonietta Barcelona, RN on 05/06/2020 11:32 PM. (History)       HPI: 86 y.o. 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  The patient has had cardiomyopathy with ejection fraction of 35% in 2008 but increased to in the 40% range after appropriate therapy.  Since then he has been on appropriate medication management and no evidence of significant trouble.  The patient therefore has been doing some weed eating in the yard to a minimal degree but cannot do a lot of other activity due to his knees.  The patient has been on appropriate hypertension medication management and has done fairly well.  He had a severe episode of upper abdominal discomfort in his upper stomach area nonradiating which was significant to his degree and lasted for several hours.  It was relieved by rest as well as some other types of medication management although lingered.  Currently he is completely pain-free.  EKG has shown normal sinus rhythm left anterior fascicular block and frequent preventricular contractions not significantly changed from before.  Troponin is 23 and 32 and BNP is 586.  Chest x-ray is normal.  Therefore it does not appear that he is having an acute coronary syndrome at this time and can further observe for other issues  Past Medical History:  Diagnosis Date  . BPH (benign prostatic hyperplasia)   . Cancer  ([place])    SKIN  . Cardiomyopathy ([place])   . Chest pain, unspecified   . Coronary artery disease   . Coronary artery ectasia ([place])   . Diabetes mellitus without complication ([place])   . Dyspnea    DOE  . Dysrhythmia   . Encounter for colonoscopy due to history of adenomatous colonic polyps   . History of BPH   . History of hiatal hernia   . HOH (hard of hearing)    AIDS  . Hypertension   . Kidney cyst, acquired   . PAF (paroxysmal atrial fibrillation) ([place])   . Ventral hernia       Surgical History:  Past Surgical History:  Procedure Laterality Date  . APPENDECTOMY    . CARDIAC CATHETERIZATION     2008  . CATARACT EXTRACTION W/PHACO Left 10/27/2017   Procedure: CATARACT EXTRACTION PHACO AND INTRAOCULAR LENS PLACEMENT ([place]);  Surgeon: Birder Robson, MD;  Location: [facility] ORS;  Service: Ophthalmology;  Laterality: Left;  [place] 00:44 CDE 6.12 fluid pack lot # [id number]  . CATARACT EXTRACTION W/PHACO Right 11/24/2017   Procedure: CATARACT EXTRACTION PHACO AND INTRAOCULAR LENS PLACEMENT (IOC);  Surgeon: Birder Robson, MD;  Location: [facility] ORS;  Service: Ophthalmology;  Laterality: Right;  [place] 00:48.4 CDE 6.22 Fluid Pack lot # [id number]  . CHOLECYSTECTOMY    . COLONOSCOPY WITH PROPOFOL N/A 05/27/2017   Procedure: COLONOSCOPY WITH PROPOFOL;  Surgeon: Manya Silvas, MD;  Location: [facility] ENDOSCOPY;  Service: Endoscopy;  Laterality: N/A;  . DENTAL SURGERY    .  EYE SURGERY    . SEPTOPLASTY    . SKIN SURGERY     skin cancer surgery  . 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 TONSILLECTOMY       Home Meds: Prior to Admission medications   Medication Sig Start Date End Date Taking? Authorizing Provider  acetaminophen (TYLENOL) 500 MG tablet Take 500 mg by mouth every 6 (six) hours as needed for moderate pain or headache.   Yes [provider]  amLODipine (NORVASC) 5 MG tablet Take 5 mg by mouth every evening.    Yes [provider]   aspirin EC 81 MG tablet Take 81 mg by mouth every evening.    Yes [provider]  atorvastatin (LIPITOR) 20 MG tablet Take 20 mg by mouth every evening.    Yes [provider]  carvedilol (COREG) 3.125 MG tablet Take 1.5625 mg by mouth 2 (two) times daily with a meal.    Yes [provider]  clopidogrel (PLAVIX) 75 MG tablet Take 75 mg by mouth daily.   Yes [provider]  enalapril (VASOTEC) 20 MG tablet Take 20 mg by mouth 2 (two) times daily.   Yes [provider]  Glucosamine-Chondroitin 1500-1200 MG/30ML LIQD Take 15 mLs by mouth 2 (two) times daily.   Yes [provider]  Multiple Vitamin (MULTIVITAMIN) tablet Take 1 tablet by mouth daily.   Yes [provider]  spironolactone (ALDACTONE) 25 MG tablet Take 12.5 mg by mouth daily.   Yes [provider]  ascorbic acid (VITAMIN C) 500 MG tablet Take 500 mg by mouth daily. Patient not taking: No sig reported    [provider]    Inpatient Medications:  . amLODipine  5 mg Oral QPM  . [START ON 05/08/2020] aspirin EC  81 mg Oral Daily  . atorvastatin  40 mg Oral Daily  . carvedilol  6.25 mg Oral BID WC  . enalapril  20 mg Oral BID  . enoxaparin (LOVENOX) injection  40 mg Subcutaneous Q24H  . spironolactone  12.5 mg Oral Daily     Allergies:  Allergies  Allergen Reactions  . Levaquin [Levofloxacin] Other (See Comments)    Altered Mental Status, Hallucinations  . Penicillins Hives    Has patient had a PCN reaction causing immediate rash, facial/tongue/throat swelling, SOB or lightheadedness with hypotension: No Has patient had a PCN reaction causing severe rash involving mucus membranes or skin necrosis: Yes Has patient had a PCN reaction that required hospitalization: Yes Has patient had a PCN reaction occurring within the last 10 years: No If all of the above answers are "NO", then may proceed with Cephalosporin use.     Social History    Socioeconomic History  . Marital status: Married    Spouse name: Not on file  . Number of children: Not on file  . Years of education: Not on file  . Highest education level: Not on file  Occupational History  . Not on file  Tobacco Use  . Smoking status: Never Smoker  . Smokeless tobacco: Never Used  Vaping Use  . Vaping Use: Never used  Substance and Sexual Activity  . Alcohol use: Not Currently    Comment: occas  . Drug use: No  . Sexual activity: Not Currently  Other Topics Concern  . Not on file  Social History Narrative  . Not on file   Social Determinants of Health   Financial Resource Strain: Not  on file  Food Insecurity: Not on file  Transportation Needs: Not on file  Physical Activity: Not on file  Stress: Not on file  Social Connections: Not on file  Intimate Partner Violence: Not on file     Family History  Problem Relation Age of Onset  . Alzheimer's disease Mother   . Osteoarthritis Mother   . Hypertension Father   . Stroke Father   . Colon cancer Father   . Prostate cancer Father      Review of Systems Positive for chest pain Negative for: General:  chills, fever, night sweats or weight changes.  Cardiovascular: PND orthopnea syncope dizziness  Dermatological skin lesions rashes Respiratory: Cough congestion Urologic: Frequent urination urination at night and hematuria Abdominal: negative for nausea, vomiting, diarrhea, bright red blood per rectum, melena, or hematemesis Neurologic: negative for visual changes, and/or hearing changes  All other systems reviewed and are otherwise negative except as noted above.  Labs: No results for input(s): CKTOTAL, CKMB, TROPONINI in the last 72 hours. Lab Results  Component Value Date   WBC 5.0 05/06/2020   HGB 12.6 (L) 05/06/2020   HCT 38.7 (L) 05/06/2020   MCV 86.6 05/06/2020   PLT 172 05/06/2020    Recent Labs  Lab 05/06/20 2341  NA 139  K 4.2  CL 106  CO2 25  BUN 29*  CREATININE 0.95   CALCIUM 8.8*  PROT 6.4*  BILITOT 1.3*  ALKPHOS 59  ALT 18  AST 23  GLUCOSE 129*   No results found for: CHOL, HDL, LDLCALC, TRIG No results found for: DDIMER  Radiology/Studies:  DG Chest 2 View  Result Date: 05/07/2020 CLINICAL DATA:  Chest pain starting in our ago. EXAM: CHEST - 2 VIEW COMPARISON:  11/17/2015 FINDINGS: Shallow inspiration. Heart size and pulmonary vascularity are likely normal for inspiratory effort. No airspace disease or consolidation in the lungs. No pleural effusions. No pneumothorax. Mediastinal contours appear intact. Degenerative changes in the spine and shoulders. IMPRESSION: Shallow inspiration. No evidence of active pulmonary disease. Electronically Signed   By: Lucienne Capers M.D.   On: 05/07/2020 00:51    EKG: Normal sinus rhythm with left axis deviation and right bundle branch block with frequent preventricular contractions  Weights: Filed Weights   05/06/20 2336  Weight: 91.2 kg     Physical Exam: Blood pressure (!) 144/86, pulse (!) 55, temperature 98.1 F (36.7 C), temperature source Oral, resp. rate 12, height 5\' 10"  (1.778 m), weight 91.2 kg, SpO2 99 %. Body mass index is 28.84 kg/m. General: Well developed, well nourished, in no acute distress. Head eyes ears nose throat: Normocephalic, atraumatic, sclera non-icteric, no xanthomas, nares are without discharge. No apparent thyromegaly and/or mass  Lungs: Normal respiratory effort.  no wheezes, no rales, no rhonchi.  Heart: RRR with normal S1 S2. no murmur gallop, no rub, PMI is normal size and placement, carotid upstroke normal without bruit, jugular venous pressure is normal Abdomen: Soft, non-tender, non-distended with normoactive bowel sounds. No hepatomegaly. No rebound/guarding. No obvious abdominal masses. Abdominal aorta is normal size without bruit Extremities: Trace edema. no cyanosis, no clubbing, no ulcers  Peripheral : 2+ bilateral upper extremity pulses, 2+ bilateral femoral  pulses, 2+ bilateral dorsal pedal pulse Neuro: Alert and oriented. No facial asymmetry. No focal deficit. Moves all extremities spontaneously. Musculoskeletal: Normal muscle tone without kyphosis Psych:  Responds to questions appropriately with a normal affect.    Assessment: 86 year old male with hypertension hyperlipidemia coronary artery ectasia with acute upper abdominal  discomfort without current evidence of congestive heart failure or acute coronary syndrome  Plan: 1.  Continue observation and repeat troponin for third evaluation to assess for any acute coronary syndrome 2.  Reinstatement of all medication management as outpatient for cardiomyopathy and LV systolic dysfunction 3.  Begin ambulation and follow-up for improvements of symptoms and would consider the possibility of echocardiogram.  Other evaluation includes the possibility of stress test although currently the patient would need a Lexiscan.  No further invasive procedures have been considered at this time  Signed, Corey Skains M.D. [facility] 05/07/2020, 8:48 AM

## 2020-05-07 NOTE — ED Notes (Signed)
Pt moved for pelvic strecher to ed strecher bed for comfort.

## 2020-05-07 NOTE — ED Notes (Signed)
Patient is resting comfortably. Fall precautions in place. Call light in reach. Wife at bedside.

## 2020-05-07 NOTE — ED Notes (Signed)
ED Provider Ward at bedside. 

## 2020-05-07 NOTE — Discharge Summary (Signed)
Malik Wells, is a 86 y.o. male  DOB 1934/07/08  MRN [id number].  Admission date:  05/06/2020  Admitting Physician  Athena Masse, MD  Discharge Date:  05/07/2020   Primary MD  Donnamarie Rossetti, PA-C  Recommendations for primary care physician for things to follow:  - patient to follow with cardiology as outpatient.   Admission Diagnosis  Chest pain [R07.9]   Discharge Diagnosis  Chest pain [R07.9]    Principal Problem:   Chest pain Active Problems:   Essential hypertension   HFrEF (heart failure with reduced ejection fraction) (HCC)   Nonischemic dilated cardiomyopathy (HCC)   Congenital cystic kidney disease   Mixed hyperlipidemia   Coronary artery ectasia (HCC)      Past Medical History:  Diagnosis Date  . BPH (benign prostatic hyperplasia)   . Cancer ([place])    SKIN  . Cardiomyopathy ([place])   . Chest pain, unspecified   . Coronary artery disease   . Coronary artery ectasia ([place])   . Diabetes mellitus without complication ([place])   . Dyspnea    DOE  . Dysrhythmia   . Encounter for colonoscopy due to history of adenomatous colonic polyps   . History of BPH   . History of hiatal hernia   . HOH (hard of hearing)    AIDS  . Hypertension   . Kidney cyst, acquired   . PAF (paroxysmal atrial fibrillation) ([place])   . Ventral hernia     Past Surgical History:  Procedure Laterality Date  . APPENDECTOMY    . CARDIAC CATHETERIZATION     2008  . CATARACT EXTRACTION W/PHACO Left 10/27/2017   Procedure: CATARACT EXTRACTION PHACO AND INTRAOCULAR LENS PLACEMENT ([place]);  Surgeon: Birder Robson, MD;  Location: [facility] ORS;  Service: Ophthalmology;  Laterality: Left;  [place] 00:44 CDE 6.12 fluid pack lot # [id number]  . CATARACT EXTRACTION W/PHACO Right 11/24/2017   Procedure: CATARACT EXTRACTION PHACO AND INTRAOCULAR LENS PLACEMENT (IOC);  Surgeon: Birder Robson, MD;  Location:  [facility] ORS;  Service: Ophthalmology;  Laterality: Right;  [place] 00:48.4 CDE 6.22 Fluid Pack lot # [id number]  . CHOLECYSTECTOMY    . COLONOSCOPY WITH PROPOFOL N/A 05/27/2017   Procedure: COLONOSCOPY WITH PROPOFOL;  Surgeon: Manya Silvas, MD;  Location: [facility] ENDOSCOPY;  Service: Endoscopy;  Laterality: N/A;  . DENTAL SURGERY    . EYE SURGERY    . SEPTOPLASTY    . SKIN SURGERY     skin cancer surgery  . 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 TONSILLECTOMY         History of present illness and  Hospital Course:     Kindly see H&P for history of present illness and admission details, please review complete Labs, Consult reports and Test reports for all details in brief  HPI  from the history and physical done on the day of admission 05/07/2020  HPI: Malik Wells is a 86 y.o. male with medical history significant for CAD on  DAPT without history of stent, last cath 2008 (diffuse ectasia involving LM and LCx), HFrEF secondary to nonischemic cardiomyopathy, last EF 35% 09/2019, , followed by [facility] cardiology who presents to the emergency room with typical chest pain onset an hour prior to arrival while sitting in a chair.  Pain is described as a pressure in the center of his chest nonradiating no aggravating or relieving factors.  He denies nausea, vomiting or diaphoresis, denies cough, fever or chills.  Denies lower extremity pain or swelling.  Pain improved with aspirin and sublingual NTG x3 administered by EMS. ED course: On arrival, pain was well 1 out of 10.  BP 157/70, pulse 93, respirations 12, O2 sat 97% on room air and afebrile.  Troponin was 23/32 with BNP 586.3.  Labs, CBC, CMP and lipase were  unremarkable. EKG, personally reviewed and interpreted: Sinus rhythm at 80 with multiple PVCs and RBBB with no acute ST-T wave changes Imaging: Chest x-ray: No active cardiopulmonary disease  Patient received morphine in the emergency room with resolution of  pain.  Hospitalist consulted for admission.  Hospital Course    86 year old male with history of ?CAD on DAPT without history of stent, last cath 2008 (diffuse ectasia involving LM and LCx), HFrEF secondary to nonischemic cardiomyopathy, last EF 35% 09/2019, , followed by [facility] cardiology presenting with typical chest pain resolved with nitro and morphine.   Chest pain/  Coronary artery ectasia (HCC) -Chest pain has mixed features, typical feature as it is resolved by nitro, as well some nontypical features at its  at rest, and not with activity -Cardiology input greatly appreciated, ACS ruled out, high-sensitivity troponins 23> 32> 28, patient ambulated in the hallway around > 150 feet, with no recurrence of symptoms, no dyspnea, no chest pain, discussed with cardiology, patient can be discharged today, given flat troponins, and no recurrent EKG, and he will follow as an outpatient to arrange for further testing(echo versus stress test). -He will be discharged on prn  sublingual nitro - Continue aspirin and Plavix, beta-blocker and statin     HFrEF (heart failure with reduced ejection fraction) ([place])   Nonischemic dilated cardiomyopathy ([place]) - Not acutely exacerbated - Last EF 35% September 2021, was 34% on cath in 2008 - Continue carvedilol, enalapril and spironolactone pending med rec - Daily weights    Congenital cystic kidney disease - No acute concerns at this time    Mixed hyperlipidemia - Continue atorvastatin     Discharge Condition:  stable   Follow UP   Follow-up Information    Corey Skains, MD Follow up in 1 week(s).   Specialty: Cardiology Contact information: [facility] [place] [address] [phone]                 Discharge Instructions  and  Discharge Medications    Discharge Instructions    Increase activity slowly   Complete by: As directed      Allergies as of 05/07/2020       Reactions   Levaquin [levofloxacin] Other (See Comments)   Altered Mental Status, Hallucinations   Penicillins Hives   Has patient had a PCN reaction causing immediate rash, facial/tongue/throat swelling, SOB or lightheadedness with hypotension: No Has patient had a PCN reaction causing severe rash involving mucus membranes or skin necrosis: Yes Has patient had a PCN reaction that required hospitalization: Yes Has patient had a PCN reaction occurring within the last 10 years: No If all of the above  answers are "NO", then may proceed with Cephalosporin use.      Medication List    TAKE these medications   acetaminophen 500 MG tablet Commonly known as: TYLENOL Take 500 mg by mouth every 6 (six) hours as needed for moderate pain or headache.   amLODipine 5 MG tablet Commonly known as: NORVASC Take 5 mg by mouth every evening.   ascorbic acid 500 MG tablet Commonly known as: VITAMIN C Take 500 mg by mouth daily.   aspirin EC 81 MG tablet Take 81 mg by mouth every evening.   atorvastatin 20 MG tablet Commonly known as: LIPITOR Take 20 mg by mouth every evening.   carvedilol 3.125 MG tablet Commonly known as: COREG Take 1.5625 mg by mouth 2 (two) times daily with a meal.   clopidogrel 75 MG tablet Commonly known as: PLAVIX Take 75 mg by mouth daily.   enalapril 20 MG tablet Commonly known as: VASOTEC Take 20 mg by mouth 2 (two) times daily.   Glucosamine-Chondroitin 1500-1200 MG/30ML Liqd Take 15 mLs by mouth 2 (two) times daily.   multivitamin tablet Take 1 tablet by mouth daily.   nitroGLYCERIN 0.4 MG SL tablet Commonly known as: Nitrostat Place 1 tablet (0.4 mg total) under the tongue every 5 (five) minutes as needed for chest pain.   spironolactone 25 MG tablet Commonly known as: ALDACTONE Take 12.5 mg by mouth daily.         Diet and Activity recommendation: See Discharge Instructions above   Consults obtained -  Cardiology   Major procedures and  Radiology Reports - PLEASE review detailed and final reports for all details, in brief -     DG Chest 2 View  Result Date: 05/07/2020 CLINICAL DATA:  Chest pain starting in our ago. EXAM: CHEST - 2 VIEW COMPARISON:  11/17/2015 FINDINGS: Shallow inspiration. Heart size and pulmonary vascularity are likely normal for inspiratory effort. No airspace disease or consolidation in the lungs. No pleural effusions. No pneumothorax. Mediastinal contours appear intact. Degenerative changes in the spine and shoulders. IMPRESSION: Shallow inspiration. No evidence of active pulmonary disease. Electronically Signed   By: Lucienne Capers M.D.   On: 05/07/2020 00:51    Micro Results     Recent Results (from the past 240 hour(s))  Resp Panel by RT-PCR (Flu A&B, Covid) Nasopharyngeal Swab     Status: None   Collection Time: 05/07/20  2:45 AM   Specimen: Nasopharyngeal Swab; Nasopharyngeal(NP) swabs in vial transport medium  Result Value Ref Range Status   SARS Coronavirus 2 by RT PCR NEGATIVE NEGATIVE Final    Comment: (NOTE) SARS-CoV-2 target nucleic acids are NOT DETECTED.  The SARS-CoV-2 RNA is generally detectable in upper respiratory specimens during the acute phase of infection. The lowest concentration of SARS-CoV-2 viral copies this assay can detect is 138 copies/mL. A negative result does not preclude SARS-Cov-2 infection and should not be used as the sole basis for treatment or other patient management decisions. A negative result may occur with  improper specimen collection/handling, submission of specimen other than nasopharyngeal swab, presence of viral mutation(s) within the areas targeted by this assay, and inadequate number of viral copies(<138 copies/mL). A negative result must be combined with clinical observations, patient history, and epidemiological information. The expected result is Negative.  Fact Sheet for Patients:  EntrepreneurPulse.com.au  Fact Sheet  for Healthcare Providers:  IncredibleEmployment.be  This test is no t yet approved or cleared by the [place] and  has been authorized  for detection and/or diagnosis of SARS-CoV-2 by FDA under an Emergency Use Authorization (EUA). This EUA will remain  in effect (meaning this test can be used) for the duration of the COVID-19 declaration under Section 564(b)(1) of the Act, 21 U.S.C.section 360bbb-3(b)(1), unless the authorization is terminated  or revoked sooner.       Influenza A by PCR NEGATIVE NEGATIVE Final   Influenza B by PCR NEGATIVE NEGATIVE Final    Comment: (NOTE) The Xpert Xpress SARS-CoV-2/FLU/RSV plus assay is intended as an aid in the diagnosis of influenza from Nasopharyngeal swab specimens and should not be used as a sole basis for treatment. Nasal washings and aspirates are unacceptable for Xpert Xpress SARS-CoV-2/FLU/RSV testing.  Fact Sheet for Patients: EntrepreneurPulse.com.au  Fact Sheet for Healthcare Providers: IncredibleEmployment.be  This test is not yet approved or cleared by the [place] FDA and has been authorized for detection and/or diagnosis of SARS-CoV-2 by FDA under an Emergency Use Authorization (EUA). This EUA will remain in effect (meaning this test can be used) for the duration of the COVID-19 declaration under Section 564(b)(1) of the Act, 21 U.S.C. section 360bbb-3(b)(1), unless the authorization is terminated or revoked.  Performed at [facility], [address], [place], [place] [address]        Today   Subjective:   Malik Wells today has no headache, nausea, no vomiting, no palpitation, no dyspnea, ambulated in the hallway around 150 feet with no complaints of chest pain or dyspnea.    Objective:   Blood pressure 132/71, pulse (!) 51, temperature 98.1 F (36.7 C), temperature source Oral, resp. rate 15, height 5\' 10"  (1.778 m), weight 91.2  kg, SpO2 93 %.   Intake/Output Summary (Last 24 hours) at 05/07/2020 1233 Last data filed at 05/07/2020 0414 Gross per 24 hour  Intake --  Output 400 ml  Net -400 ml    Exam Awake Alert, Oriented x 3, No new F.N deficits, Normal affect Symmetrical Chest wall movement, Good air movement bilaterally, CTAB RRR,No Gallops,Rubs or new Murmurs, No Parasternal Heave +ve B.Sounds, Abd Soft No Cyanosis, Clubbing or edema,   Data Review   CBC w Diff:  Lab Results  Component Value Date   WBC 4.7 05/07/2020   HGB 13.0 05/07/2020   HCT 39.6 05/07/2020   PLT 181 05/07/2020   LYMPHOPCT 21 05/06/2020   MONOPCT 7 05/06/2020   EOSPCT 4 05/06/2020   BASOPCT 1 05/06/2020    CMP:  Lab Results  Component Value Date   NA 139 05/06/2020   K 4.2 05/06/2020   CL 106 05/06/2020   CO2 25 05/06/2020   BUN 29 (H) 05/06/2020   CREATININE 0.90 05/07/2020   PROT 6.4 (L) 05/06/2020   ALBUMIN 3.9 05/06/2020   BILITOT 1.3 (H) 05/06/2020   ALKPHOS 59 05/06/2020   AST 23 05/06/2020   ALT 18 05/06/2020  .   Total Time in preparing paper work, data evaluation and todays exam - 22 minutes  Phillips Climes M.D on 05/07/2020 at 12:33 PM  [facility]   Office  [phone]

## 2020-05-07 NOTE — ED Notes (Signed)
Patient eating breakfast. Fall precautions in place. Call light in reach. Wife at bedside.

## 2020-05-07 NOTE — ED Notes (Signed)
Pt in XR. 

## 2020-05-07 NOTE — H&P (Signed)
History and Physical    Malik Wells [id number] DOB: [birth year] DOA: 05/06/2020  PCP: Donnamarie Rossetti, PA-C   Patient coming from: Home  I have personally briefly reviewed patient's old medical records in [place]  Chief Complaint: Chest pain  HPI: Malik Wells is a 86 y.o. male with medical history significant for CAD on DAPT without history of stent, last cath 2008 (diffuse ectasia involving LM and LCx), HFrEF secondary to nonischemic cardiomyopathy, last EF 35% 09/2019, , followed by [facility] cardiology who presents to the emergency room with typical chest pain onset an hour prior to arrival while sitting in a chair.  Pain is described as a pressure in the center of his chest nonradiating no aggravating or relieving factors.  He denies nausea, vomiting or diaphoresis, denies cough, fever or chills.  Denies lower extremity pain or swelling.  Pain improved with aspirin and sublingual NTG x3 administered by EMS. ED course: On arrival, pain was well 1 out of 10.  BP 157/70, pulse 93, respirations 12, O2 sat 97% on room air and afebrile.  Troponin was 23/32 with BNP 586.3.  Labs, CBC, CMP and lipase were  unremarkable. EKG, personally reviewed and interpreted: Sinus rhythm at 80 with multiple PVCs and RBBB with no acute ST-T wave changes Imaging: Chest x-ray: No active cardiopulmonary disease  Patient received morphine in the emergency room with resolution of pain.  Hospitalist consulted for admission.  Review of Systems: As per HPI otherwise all other systems on review of systems negative.    Past Medical History:  Diagnosis Date  . BPH (benign prostatic hyperplasia)   . Cancer ([place])    SKIN  . Cardiomyopathy ([place])   . Chest pain, unspecified   . Coronary artery disease   . 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 Diabetes mellitus without complication ([place])   . Dyspnea    DOE  . Dysrhythmia   . Encounter for colonoscopy due to history of adenomatous  colonic polyps   . History of BPH   . History of hiatal hernia   . HOH (hard of hearing)    AIDS  . Hypertension   . Kidney cyst, acquired   . PAF (paroxysmal atrial fibrillation) ([place])   . Ventral hernia     Past Surgical History:  Procedure Laterality Date  . APPENDECTOMY    . CARDIAC CATHETERIZATION     2008  . CATARACT EXTRACTION W/PHACO Left 10/27/2017   Procedure: CATARACT EXTRACTION PHACO AND INTRAOCULAR LENS PLACEMENT ([place]);  Surgeon: Birder Robson, MD;  Location: [facility] ORS;  Service: Ophthalmology;  Laterality: Left;  [place] 00:44 CDE 6.12 fluid pack lot # [id number]  . CATARACT EXTRACTION W/PHACO Right 11/24/2017   Procedure: CATARACT EXTRACTION PHACO AND INTRAOCULAR LENS PLACEMENT ([facility]);  Surgeon: Birder Robson, MD;  Location: [facility] ORS;  Service: Ophthalmology;  Laterality: Right;  [place] 00:48.4 CDE 6.22 Fluid Pack lot # [id number]  . CHOLECYSTECTOMY    . COLONOSCOPY WITH PROPOFOL N/A 05/27/2017   Procedure: COLONOSCOPY WITH PROPOFOL;  Surgeon: Manya Silvas, MD;  Location: [facility] ENDOSCOPY;  Service: Endoscopy;  Laterality: N/A;  . DENTAL SURGERY    . EYE SURGERY    . SEPTOPLASTY    . SKIN SURGERY     skin cancer surgery  . 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 TONSILLECTOMY       reports that he has never smoked. He has never used smokeless tobacco. He reports previous  alcohol use. He reports that he does not use drugs.  Allergies  Allergen Reactions  . Levaquin [Levofloxacin] Other (See Comments)    Altered Mental Status, Hallucinations  . Penicillins Hives    Has patient had a PCN reaction causing immediate rash, facial/tongue/throat swelling, SOB or lightheadedness with hypotension: No Has patient had a PCN reaction causing severe rash involving mucus membranes or skin necrosis: Yes Has patient had a PCN reaction that required hospitalization: Yes Has patient had a PCN reaction occurring within the last 10 years: No If all of  the above answers are "NO", then may proceed with Cephalosporin use.     Family History  Problem Relation Age of Onset  . Alzheimer's disease Mother   . Osteoarthritis Mother   . Hypertension Father   . Stroke Father   . Colon cancer Father   . Prostate cancer Father       Prior to Admission medications   Medication Sig Start Date End Date Taking? Authorizing Provider  acetaminophen (TYLENOL) 500 MG tablet Take 500 mg by mouth every 6 (six) hours as needed for moderate pain or headache.    [provider]  amLODipine (NORVASC) 5 MG tablet Take 5 mg by mouth every evening.     [provider]  ascorbic acid (VITAMIN C) 500 MG tablet Take 500 mg by mouth daily.    [provider]  aspirin EC 81 MG tablet Take 81 mg by mouth every evening.     [provider]  atorvastatin (LIPITOR) 20 MG tablet Take 20 mg by mouth every evening.     [provider]  carvedilol (COREG) 3.125 MG tablet Take 1.5625 mg by mouth 2 (two) times daily with a meal.     [provider]  clopidogrel (PLAVIX) 75 MG tablet Take 75 mg by mouth daily.    [provider]  enalapril (VASOTEC) 20 MG tablet Take 20 mg by mouth 2 (two) times daily.    [provider]  Glucosamine-Chondroitin 1500-1200 MG/30ML LIQD Take 15 mLs by mouth 2 (two) times daily.    [provider]  Multiple Vitamin (MULTIVITAMIN) tablet Take 1 tablet by mouth daily.    [provider]  spironolactone (ALDACTONE) 25 MG tablet Take 12.5 mg by mouth daily.    [provider]    Physical Exam: Vitals:   05/07/20 0100 05/07/20 0130 05/07/20 0200 05/07/20 0230  BP: 113/75 124/74 128/72 128/77  Pulse: (!) 58 61 (!) 55 (!) 53  Resp:  15 14 12   Temp:      TempSrc:      SpO2: 95% 94% 95% 95%  Weight:      Height:         Vitals:   05/07/20 0100 05/07/20 0130 05/07/20 0200 05/07/20 0230  BP: 113/75 124/74 128/72 128/77  Pulse: (!) 58 61 (!) 55  (!) 53  Resp:  15 14 12   Temp:      TempSrc:      SpO2: 95% 94% 95% 95%  Weight:      Height:          Constitutional: Alert and oriented x 3 . Not in any apparent distress HEENT:      Head: Normocephalic and atraumatic.         Eyes: PERLA, EOMI, Conjunctivae are normal. Sclera is non-icteric.       Mouth/Throat: Mucous membranes are moist.       Neck: Supple with no signs of  meningismus. Cardiovascular: Regular rate and rhythm. No murmurs, gallops, or rubs. 2+ symmetrical distal pulses are present . No JVD. No LE edema Respiratory: Respiratory effort normal .Lungs sounds clear bilaterally. No wheezes, crackles, or rhonchi.  Gastrointestinal: Soft, non tender, and non distended with positive bowel sounds.  Genitourinary: No CVA tenderness. Musculoskeletal: Nontender with normal range of motion in all extremities. No cyanosis, or erythema of extremities. Neurologic:  Face is symmetric. Moving all extremities. No gross focal neurologic deficits . Skin: Skin is warm, dry.  No rash or ulcers Psychiatric: Mood and affect are normal    Labs on Admission: I have personally reviewed following labs and imaging studies  CBC: Recent Labs  Lab 05/06/20 2341  WBC 5.0  NEUTROABS 3.4  HGB 12.6*  HCT 38.7*  MCV 86.6  PLT 326   Basic Metabolic Panel: Recent Labs  Lab 05/06/20 2341  NA 139  K 4.2  CL 106  CO2 25  GLUCOSE 129*  BUN 29*  CREATININE 0.95  CALCIUM 8.8*  MG 2.1   GFR: Estimated Creatinine Clearance: 66.9 mL/min (by C-G formula based on SCr of 0.95 mg/dL). Liver Function Tests: Recent Labs  Lab 05/06/20 2341  AST 23  ALT 18  ALKPHOS 59  BILITOT 1.3*  PROT 6.4*  ALBUMIN 3.9   Recent Labs  Lab 05/06/20 2341  LIPASE 34   No results for input(s): AMMONIA in the last 168 hours. Coagulation Profile: No results for input(s): INR, PROTIME in the last 168 hours. Cardiac Enzymes: No results for input(s): CKTOTAL, CKMB, CKMBINDEX, TROPONINI in the last 168  hours. BNP (last 3 results) No results for input(s): PROBNP in the last 8760 hours. HbA1C: No results for input(s): HGBA1C in the last 72 hours. CBG: No results for input(s): GLUCAP in the last 168 hours. Lipid Profile: No results for input(s): CHOL, HDL, LDLCALC, TRIG, CHOLHDL, LDLDIRECT in the last 72 hours. Thyroid Function Tests: No results for input(s): TSH, T4TOTAL, FREET4, T3FREE, THYROIDAB in the last 72 hours. Anemia Panel: No results for input(s): VITAMINB12, FOLATE, FERRITIN, TIBC, IRON, RETICCTPCT in the last 72 hours. Urine analysis: No results found for: COLORURINE, APPEARANCEUR, LABSPEC, PHURINE, GLUCOSEU, HGBUR, BILIRUBINUR, KETONESUR, PROTEINUR, UROBILINOGEN, NITRITE, LEUKOCYTESUR  Radiological Exams on Admission: DG Chest 2 View  Result Date: 05/07/2020 CLINICAL DATA:  Chest pain starting in our ago. EXAM: CHEST - 2 VIEW COMPARISON:  11/17/2015 FINDINGS: Shallow inspiration. Heart size and pulmonary vascularity are likely normal for inspiratory effort. No airspace disease or consolidation in the lungs. No pleural effusions. No pneumothorax. Mediastinal contours appear intact. Degenerative changes in the spine and shoulders. IMPRESSION: Shallow inspiration. No evidence of active pulmonary disease. Electronically Signed   By: Lucienne Capers M.D.   On: 05/07/2020 00:51     Assessment/Plan 86 year old male with history of ?CAD on DAPT without history of stent, last cath 2008 (diffuse ectasia involving LM and LCx), HFrEF secondary to nonischemic cardiomyopathy, last EF 35% 09/2019, , followed by [facility] cardiology presenting with typical chest pain resolved with nitro and morphine.    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[facility]) - Chest pain appears typical, relieved with SL NTG x3 and morphine - last cath 2008 (diffuse ectasia involving LM and LCx) - Troponin mildly elevated but uptrending 23>32, EKG nonacute - Continue aspirin and Plavix, beta-blocker and statin -  Cardiology consult    HFrEF (heart failure with reduced ejection fraction) ([place])   Nonischemic dilated cardiomyopathy ([place]) - Not acutely exacerbated - Last EF 35% September 2021,  was 34% on cath in 2008 - Continue carvedilol, enalapril and spironolactone pending med rec - Daily weights    Congenital cystic kidney disease - No acute concerns at this time    Mixed hyperlipidemia - Continue atorvastatin    DVT prophylaxis: Lovenox  Code Status: full code  Family Communication: Wife at bedside Disposition Plan: Back to previous home environment Consults called: Cardiology Status: Observation    Athena Masse MD [facility]     05/07/2020, 4:10 AM

## 2020-05-07 NOTE — Discharge Instructions (Signed)
Follow with Primary MD Donnamarie Rossetti, PA-C in 7 days   Get CBC, CMP, checked  by Primary MD next visit.    Activity: As tolerated with Full fall precautions use walker/cane & assistance as needed   Disposition Home    Diet: Heart Healthy  , with feeding assistance and aspiration precautions.  For Heart failure patients - Check your Weight same time everyday, if you gain over 2 pounds, or you develop in leg swelling, experience more shortness of breath or chest pain, call your Primary MD immediately. Follow Cardiac Low Salt Diet and 1.5 lit/day fluid restriction.   On your next visit with your primary care physician please Get Medicines reviewed and adjusted.   Please request your Prim.MD to go over all Hospital Tests and Procedure/Radiological results at the follow up, please get all Hospital records sent to your Prim MD by signing hospital release before you go home.   If you experience worsening of your admission symptoms, develop shortness of breath, life threatening emergency, suicidal or homicidal thoughts you must seek medical attention immediately by calling 911 or calling your MD immediately  if symptoms less severe.  You Must read complete instructions/literature along with all the possible adverse reactions/side effects for all the Medicines you take and that have been prescribed to you. Take any new Medicines after you have completely understood and accpet all the possible adverse reactions/side effects.   Do not drive, operating heavy machinery, perform activities at heights, swimming or participation in water activities or provide baby sitting services if your were admitted for syncope or siezures until you have seen by Primary MD or a Neurologist and advised to do so again.  Do not drive when taking Pain medications.    Do not take more than prescribed Pain, Sleep and Anxiety Medications  Special Instructions: If you have smoked or chewed Tobacco  in the last 2  yrs please stop smoking, stop any regular Alcohol  and or any Recreational drug use.  Wear Seat belts while driving.   Please note  You were cared for by a hospitalist during your hospital stay. If you have any questions about your discharge medications or the care you received while you were in the hospital after you are discharged, you can call the unit and asked to speak with the hospitalist on call if the hospitalist that took care of you is not available. Once you are discharged, your primary care physician will handle any further medical issues. Please note that NO REFILLS for any discharge medications will be authorized once you are discharged, as it is imperative that you return to your primary care physician (or establish a relationship with a primary care physician if you do not have one) for your aftercare needs so that they can reassess your need for medications and monitor your lab values.

## 2021-07-31 IMAGING — CR DG CHEST 2V
2 series · 2 of 2 positions shown · non-contrast
Comparison: 11/17/2015

CLINICAL DATA: Chest pain starting in our ago.

EXAM:
CHEST - 2 VIEW

[chest ap]
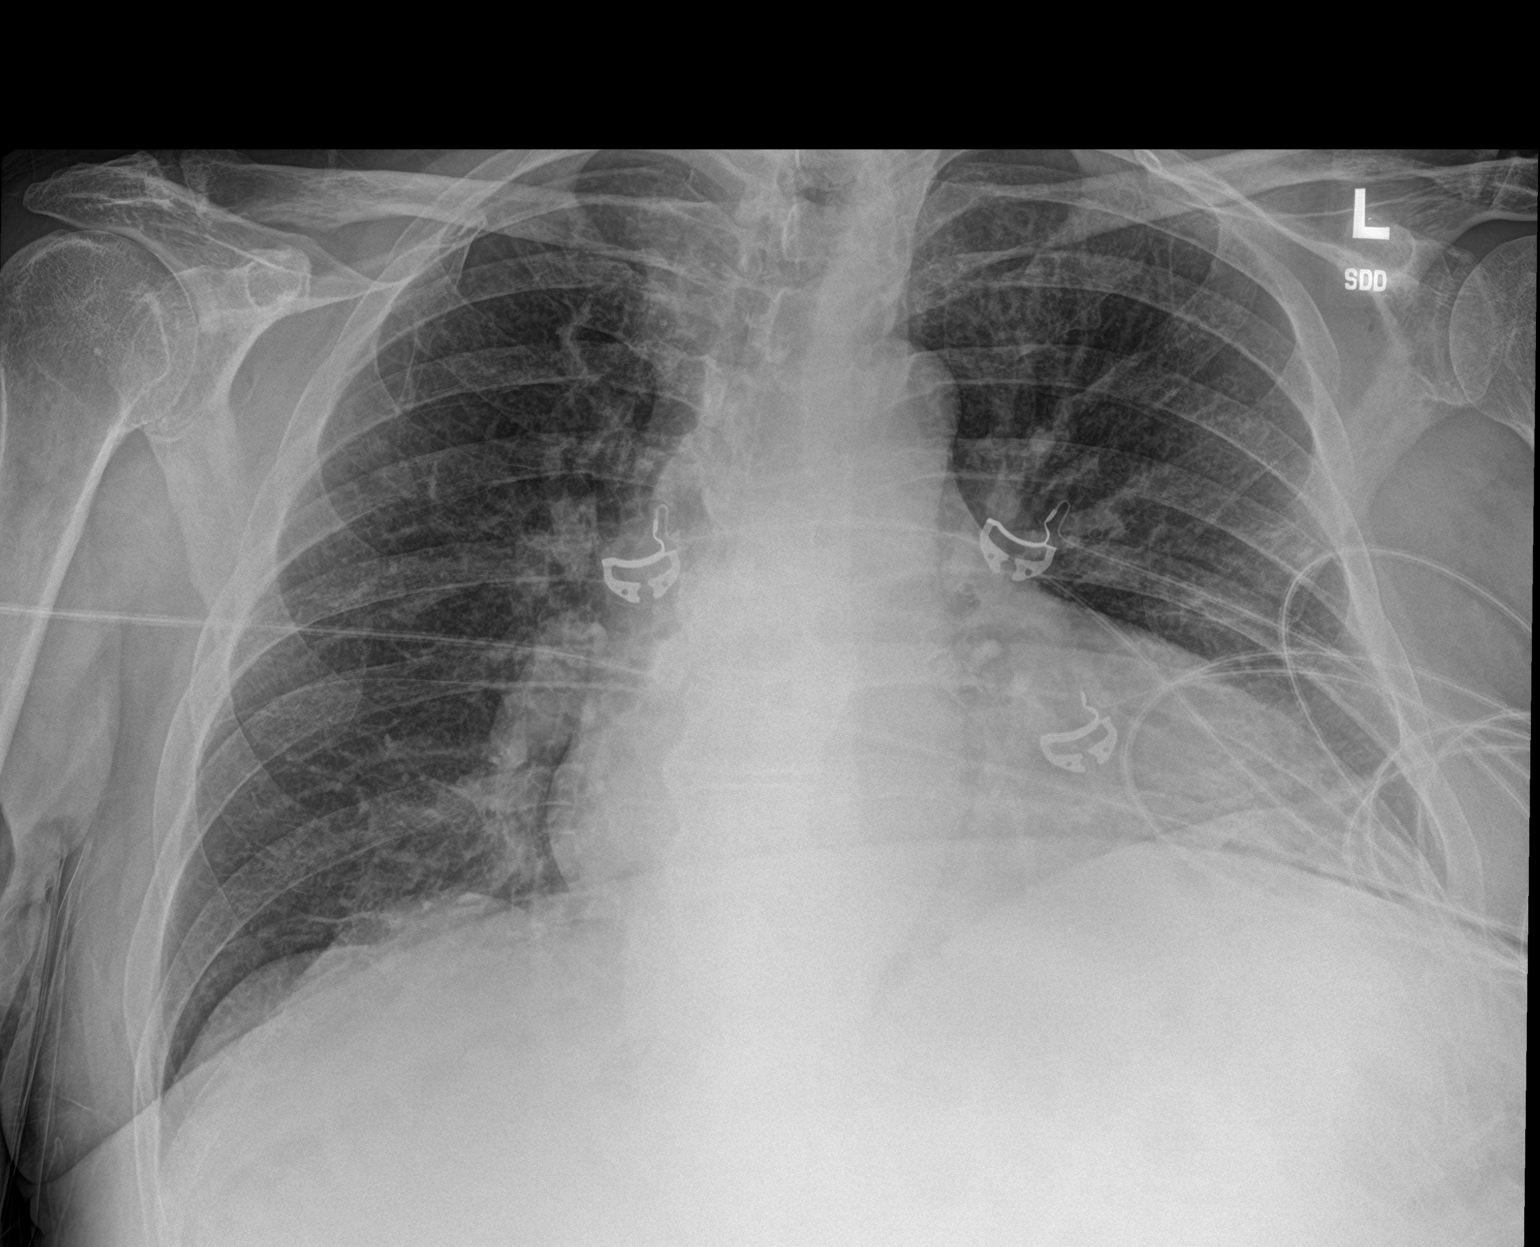

[chest lat]
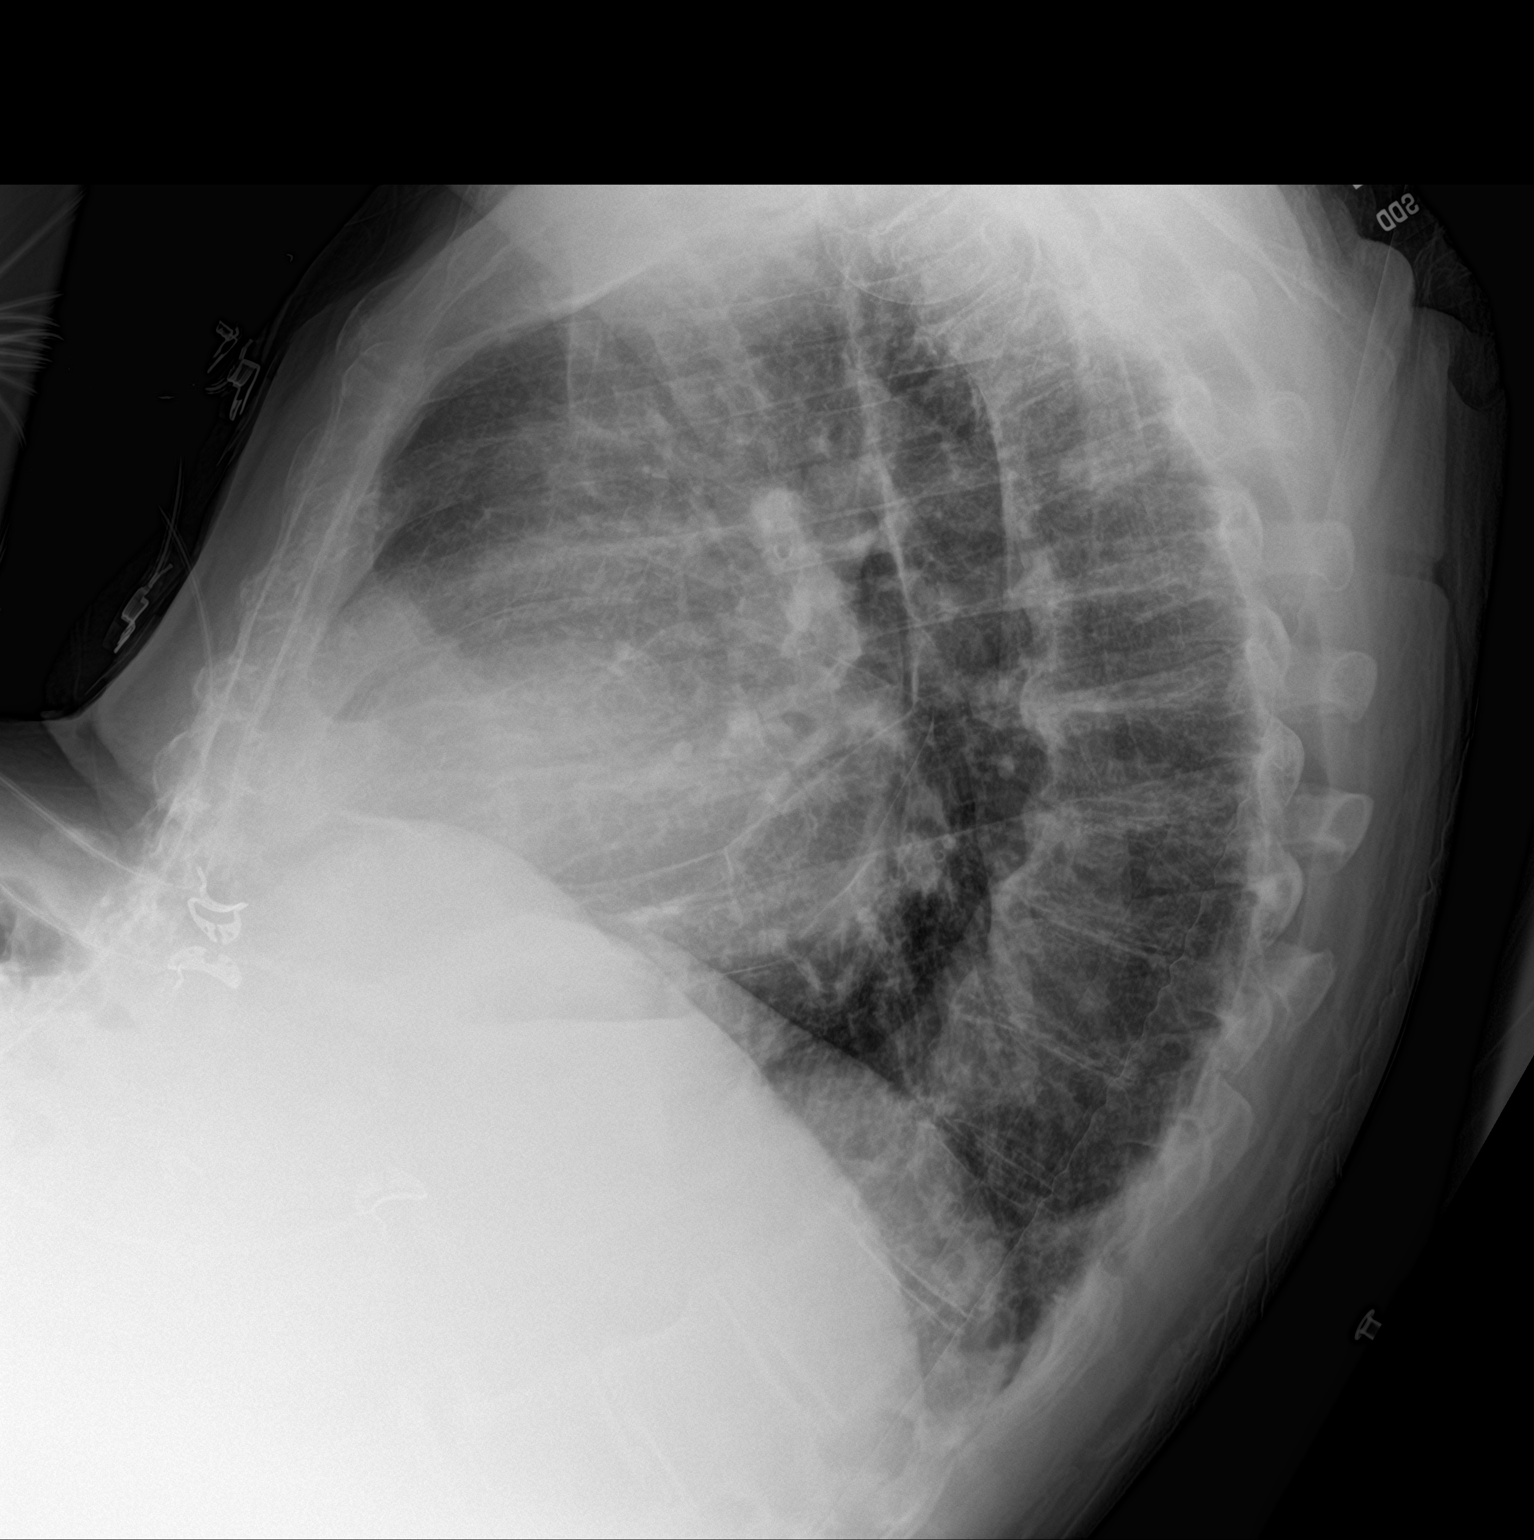

[2 of 2 positions shown; findings below may reference images not displayed]

FINDINGS: Shallow inspiration. Heart size and pulmonary vascularity are likely
normal for inspiratory effort. No airspace disease or consolidation
in the lungs. No pleural effusions. No pneumothorax. Mediastinal
contours appear intact. Degenerative changes in the spine and
shoulders.
IMPRESSION: Shallow inspiration. No evidence of active pulmonary disease.

## 2022-01-13 DEATH — deceased
